# Patient Record
Sex: Male | Born: 1985 | Race: White | Hispanic: No | Marital: Single | Smoking: Current every day smoker
Health system: Southern US, Community
[De-identification: ages and names within clinical notes are randomized; demographics above are authoritative.]

## PROBLEM LIST (undated history)

## (undated) DIAGNOSIS — F419 Anxiety disorder, unspecified: Secondary | ICD-10-CM

## (undated) DIAGNOSIS — K029 Dental caries, unspecified: Secondary | ICD-10-CM

## (undated) DIAGNOSIS — F329 Major depressive disorder, single episode, unspecified: Secondary | ICD-10-CM

## (undated) DIAGNOSIS — F909 Attention-deficit hyperactivity disorder, unspecified type: Secondary | ICD-10-CM

## (undated) DIAGNOSIS — F32A Depression, unspecified: Secondary | ICD-10-CM

## (undated) HISTORY — PX: OTHER SURGICAL HISTORY: SHX169

---
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
## 2009-08-15 ENCOUNTER — Emergency Department (HOSPITAL_COMMUNITY): Admission: EM | Admit: 2009-08-15 | Discharge: 2009-08-15 | Payer: Self-pay | Admitting: Emergency Medicine

## 2010-09-04 ENCOUNTER — Emergency Department: Payer: Self-pay | Admitting: Emergency Medicine
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
## 2015-01-20 ENCOUNTER — Emergency Department: Payer: Self-pay | Admitting: Physician Assistant

## 2015-10-01 ENCOUNTER — Emergency Department
Admission: EM | Admit: 2015-10-01 | Discharge: 2015-10-01 | Disposition: A | Discharge status: Home / Self Care | Payer: No Typology Code available for payment source | Attending: Emergency Medicine | Admitting: Emergency Medicine

## 2015-10-01 ENCOUNTER — Encounter: Payer: Self-pay | Admitting: Emergency Medicine

## 2015-10-01 ENCOUNTER — Emergency Department: Payer: No Typology Code available for payment source

## 2015-10-01 DIAGNOSIS — Y9389 Activity, other specified: Secondary | ICD-10-CM | POA: Diagnosis not present

## 2015-10-01 DIAGNOSIS — W231XXA Caught, crushed, jammed, or pinched between stationary objects, initial encounter: Secondary | ICD-10-CM | POA: Diagnosis not present

## 2015-10-01 DIAGNOSIS — S20211A Contusion of right front wall of thorax, initial encounter: Secondary | ICD-10-CM | POA: Diagnosis not present

## 2015-10-01 DIAGNOSIS — Z87891 Personal history of nicotine dependence: Secondary | ICD-10-CM | POA: Diagnosis not present

## 2015-10-01 DIAGNOSIS — Y998 Other external cause status: Secondary | ICD-10-CM | POA: Diagnosis not present

## 2015-10-01 DIAGNOSIS — Y92039 Unspecified place in apartment as the place of occurrence of the external cause: Secondary | ICD-10-CM | POA: Diagnosis not present

## 2015-10-01 DIAGNOSIS — S299XXA Unspecified injury of thorax, initial encounter: Secondary | ICD-10-CM | POA: Diagnosis present

## 2015-10-01 HISTORY — DX: Anxiety disorder, unspecified: F41.9

## 2015-10-01 HISTORY — DX: Major depressive disorder, single episode, unspecified: F32.9

## 2015-10-01 HISTORY — DX: Depression, unspecified: F32.A

## 2015-10-01 MED ORDER — IBUPROFEN 800 MG PO TABS: 800.0000 mg | ORAL_TABLET | Freq: Three times a day (TID) | ORAL | Status: DC | PRN

## 2015-10-01 MED ORDER — TRAMADOL HCL 50 MG PO TABS
100.0000 mg | ORAL_TABLET | Freq: Once | ORAL | Status: AC
  Administered 2015-10-01: 100 mg via ORAL
  Filled 2015-10-01: qty 2

## 2015-10-01 MED ORDER — TRAMADOL HCL 50 MG PO TABS: 50.0000 mg | ORAL_TABLET | Freq: Four times a day (QID) | ORAL | Status: DC | PRN

## 2015-10-01 MED ORDER — IBUPROFEN 800 MG PO TABS
800.0000 mg | ORAL_TABLET | Freq: Once | ORAL | Status: DC
  Filled 2015-10-01: qty 1
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
## 2015-10-01 NOTE — ED Notes (Signed)
Patient transported to X-ray, amb w/o assistance.

## 2015-10-01 NOTE — Discharge Instructions (Signed)
Chest Contusion °A contusion is a deep bruise. Bruises happen when an injury causes bleeding under the skin. Signs of bruising include pain, puffiness (swelling), and discolored skin. The bruise may turn blue, purple, or yellow.  °HOME CARE °· Put ice on the injured area. °¨ Put ice in a plastic bag. °¨ Place a towel between the skin and the bag. °¨ Leave the ice on for 15-20 minutes at a time, 03-04 times a day for the first 48 hours. °· Only take medicine as told by your doctor. °· Rest. °· Take deep breaths (deep-breathing exercises) as told by your doctor. °· Stop smoking if you smoke. °· Do not lift objects over 5 pounds (2.3 kilograms) for 3 days or longer if told by your doctor. °GET HELP RIGHT AWAY IF:  °· You have more bruising or puffiness. °· You have pain that gets worse. °· You have trouble breathing. °· You are dizzy, weak, or pass out (faint). °· You have blood in your pee (urine) or poop (stool). °· You cough up or throw up (vomit) blood. °· Your puffiness or pain is not helped with medicines. °MAKE SURE YOU:  °· Understand these instructions. °· Will watch your condition. °· Will get help right away if you are not doing well or get worse. °  °This information is not intended to replace advice given to you by your health care provider. Make sure you discuss any questions you have with your health care provider. °  °Document Released: 05/25/2008 Document Revised: 08/31/2012 Document Reviewed: 05/30/2012 °Elsevier Interactive Patient Education ©2016 [employer] ° °
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
## 2016-12-04 ENCOUNTER — Emergency Department (HOSPITAL_COMMUNITY)
Admission: EM | Admit: 2016-12-04 | Discharge: 2016-12-04 | Disposition: A | Discharge status: Home / Self Care | Payer: No Typology Code available for payment source | Attending: Emergency Medicine | Admitting: Emergency Medicine

## 2016-12-04 ENCOUNTER — Encounter (HOSPITAL_COMMUNITY): Payer: Self-pay | Admitting: Emergency Medicine

## 2016-12-04 DIAGNOSIS — F17290 Nicotine dependence, other tobacco product, uncomplicated: Secondary | ICD-10-CM | POA: Insufficient documentation

## 2016-12-04 DIAGNOSIS — F17220 Nicotine dependence, chewing tobacco, uncomplicated: Secondary | ICD-10-CM | POA: Insufficient documentation

## 2016-12-04 DIAGNOSIS — K0889 Other specified disorders of teeth and supporting structures: Secondary | ICD-10-CM | POA: Insufficient documentation

## 2016-12-04 MED ORDER — HYDROCODONE-ACETAMINOPHEN 5-325 MG PO TABS
1.0000 | ORAL_TABLET | Freq: Once | ORAL | Status: DC
Start: 2016-12-04 — End: 2016-12-04
  Filled 2016-12-04: qty 1

## 2016-12-04 MED ORDER — CLINDAMYCIN HCL 150 MG PO CAPS: 300.0000 mg | ORAL_CAPSULE | Freq: Four times a day (QID) | ORAL | 0 refills | Status: DC

## 2016-12-04 MED ORDER — DICLOFENAC SODIUM 75 MG PO TBEC: 75.0000 mg | DELAYED_RELEASE_TABLET | Freq: Two times a day (BID) | ORAL | 0 refills | Status: DC

## 2016-12-04 MED ORDER — CLINDAMYCIN HCL 150 MG PO CAPS
300.0000 mg | ORAL_CAPSULE | Freq: Once | ORAL | Status: AC
  Administered 2016-12-04: 300 mg via ORAL
  Filled 2016-12-04: qty 2

## 2016-12-04 NOTE — ED Triage Notes (Signed)
Having dental pain about one month.  C/o swelling to roof of mouth.  Rates pain 9/10.

## 2016-12-04 NOTE — Discharge Instructions (Signed)
Follow-up with a dentist soon.  You can contact one on the list provided

## 2016-12-04 NOTE — ED Provider Notes (Signed)
[facility]-EMERGENCY DEPT Provider Note   CSN: [id number] Arrival date & time: 12/04/16  0930     History   Chief Complaint Chief Complaint  Patient presents with  . Dental Pain    HPI Jason Morton is a 30 y.o. male.  HPI   Jason Morton is a 30 y.o. male who presents to the Emergency Department complaining of dental pain for one month.  He reports hx of widespread dental decay and persistent pain to the right upper second molar with new onset of swelling to the adjacent gums.  Pain is worse with chewing and cold foods or liquids.  He has tried OTC topical numbing agents and tylenol and ibuprofen without relief.  He states he cannot afford to see a dentist.  He denies facial swelling, fever, chills, neck pain or difficulty swallowing.    Past Medical History:  Diagnosis Date  . Anxiety   . Depression     There are no active problems to display for this patient.   History reviewed. No pertinent surgical history.     Home Medications    Prior to Admission medications   Medication Sig Start Date End Date Taking? Authorizing Provider  traMADol (ULTRAM) 50 MG tablet Take 1 tablet (50 mg total) by mouth every 6 (six) hours as needed. 10/01/15   Evon Slackhomas C Gaines, PA-C    Family History History reviewed. No pertinent family history.  Social History Social History  Substance Use Topics  . Smoking status: Current Every Day Smoker    Types: E-cigarettes  . Smokeless tobacco: Current User    Types: Chew  . Alcohol use Yes     Allergies   Patient has no known allergies.   Review of Systems Review of Systems  Constitutional: Negative for appetite change and fever.  HENT: Positive for dental problem. Negative for congestion, facial swelling, sore throat and trouble swallowing.   Eyes: Negative for pain and visual disturbance.  Musculoskeletal: Negative for neck pain and neck stiffness.  Neurological: Negative for dizziness, facial asymmetry and headaches.    Hematological: Negative for adenopathy.  All other systems reviewed and are negative.    Physical Exam Updated Vital Signs BP 106/72   Pulse 60   Temp 98.1 F (36.7 C) (Oral)   Resp 15   SpO2 99%   Physical Exam  Constitutional: He is oriented to person, place, and time. He appears well-developed and well-nourished. No distress.  HENT:  Head: Normocephalic and atraumatic.  Right Ear: Tympanic membrane and ear canal normal.  Left Ear: Tympanic membrane and ear canal normal.  Mouth/Throat: Uvula is midline, oropharynx is clear and moist and mucous membranes are normal. No trismus in the jaw. Dental caries present. No dental abscesses or uvula swelling.  Tenderness and dental caries of the right upper second molar and erythema of the surrounding gingiva, no fluctuance. Widespread dental decay.  No facial swelling, obvious dental abscess, trismus, or sublingual abnml.    Neck: Normal range of motion. Neck supple.  Cardiovascular: Normal rate, regular rhythm and normal heart sounds.   No murmur heard. Pulmonary/Chest: Effort normal and breath sounds normal.  Musculoskeletal: Normal range of motion.  Lymphadenopathy:    He has no cervical adenopathy.  Neurological: He is alert and oriented to person, place, and time. He exhibits normal muscle tone. Coordination normal.  Skin: Skin is warm and dry.  Nursing note and vitals reviewed.    ED Treatments / Results  Labs (all labs ordered are listed,  but only abnormal results are displayed) Labs Reviewed - No data to display  EKG  EKG Interpretation None       Radiology No results found.  Procedures Procedures (including critical care time)  Medications Ordered in ED Medications - No data to display   Initial Impression / Assessment and Plan / ED Course  I have reviewed the triage vital signs and the nursing notes.  Pertinent labs & imaging results that were available during my care of the patient were reviewed by me  and considered in my medical decision making (see chart for details).  Clinical Course     Pt with dental decay and possible early developing abscess.  Airway remains patent.  No clinical signs of Ludwig's angina.  Uvula is midline and non-edematous.  No trismus.  Pt encouraged to f/u with a dentist,  Referral info given.  rx for clinda and diclofenac.    Final Clinical Impressions(s) / ED Diagnoses   Final diagnoses:  Pain, dental    New Prescriptions New Prescriptions   No medications on file     Pauline Ausammy Betzayda Braxton, PA-C 12/04/16 1052    Donnetta HutchingBrian Cook, MD 12/05/16 [phone]

## 2018-12-28 ENCOUNTER — Emergency Department
Admission: EM | Admit: 2018-12-28 | Discharge: 2018-12-28 | Disposition: A | Discharge status: Home / Self Care | Payer: Self-pay | Attending: Emergency Medicine | Admitting: Emergency Medicine

## 2018-12-28 ENCOUNTER — Encounter: Payer: Self-pay | Admitting: Emergency Medicine

## 2018-12-28 ENCOUNTER — Emergency Department: Payer: Self-pay

## 2018-12-28 DIAGNOSIS — J101 Influenza due to other identified influenza virus with other respiratory manifestations: Secondary | ICD-10-CM | POA: Insufficient documentation

## 2018-12-28 DIAGNOSIS — F17290 Nicotine dependence, other tobacco product, uncomplicated: Secondary | ICD-10-CM | POA: Insufficient documentation

## 2018-12-28 LAB — INFLUENZA PANEL BY PCR (TYPE A & B)
INFLAPCR: NEGATIVE
INFLBPCR: POSITIVE — AB

## 2018-12-28 MED ORDER — ALBUTEROL SULFATE HFA 108 (90 BASE) MCG/ACT IN AERS: 2.0000 | INHALATION_SPRAY | Freq: Four times a day (QID) | RESPIRATORY_TRACT | 2 refills | Status: DC | PRN

## 2018-12-28 MED ORDER — ACETAMINOPHEN 325 MG PO TABS
ORAL_TABLET | ORAL | Status: AC
  Administered 2018-12-28: 650 mg via ORAL
  Filled 2018-12-28: qty 2

## 2018-12-28 MED ORDER — IPRATROPIUM-ALBUTEROL 0.5-2.5 (3) MG/3ML IN SOLN
3.0000 mL | Freq: Once | RESPIRATORY_TRACT | Status: AC
  Administered 2018-12-28: 3 mL via RESPIRATORY_TRACT
  Filled 2018-12-28: qty 3

## 2018-12-28 MED ORDER — OSELTAMIVIR PHOSPHATE 75 MG PO CAPS: 75.0000 mg | ORAL_CAPSULE | Freq: Two times a day (BID) | ORAL | 0 refills | Status: DC

## 2018-12-28 MED ORDER — BENZONATATE 200 MG PO CAPS: 200.0000 mg | ORAL_CAPSULE | Freq: Three times a day (TID) | ORAL | 0 refills | Status: DC | PRN

## 2018-12-28 MED ORDER — ACETAMINOPHEN 325 MG PO TABS
650.0000 mg | ORAL_TABLET | Freq: Once | ORAL | Status: AC
  Administered 2018-12-28: 650 mg via ORAL

## 2018-12-28 NOTE — ED Notes (Signed)
Fisher, PA-C at bedside.

## 2018-12-28 NOTE — ED Triage Notes (Signed)
Pt reports flu-like sx's for the past 2 dasy, unsure of fevers.

## 2018-12-28 NOTE — ED Provider Notes (Signed)
[facility] Emergency Department Provider Note  ____________________________________________   First MD Initiated Contact with Patient 12/28/18 1409     (approximate)  I have reviewed the triage vital signs and the nursing notes.   HISTORY  Chief Complaint Influenza; Cough; Nasal Congestion; Generalized Body Aches; and Headache    HPI IBRAHIMA ZARZA is a 33 y.o. male flulike symptoms, patient is complained of fever, chills, body aches.,cough, sore throat, denies vomiting, denies diarrhea; denies chest pain or sob.  Sx for 1 days   Past Medical History:  Diagnosis Date  . Anxiety   . Depression     There are no active problems to display for this patient.   History reviewed. No pertinent surgical history.  Prior to Admission medications   Medication Sig Start Date End Date Taking? Authorizing Provider  albuterol (PROVENTIL HFA;VENTOLIN HFA) 108 (90 Base) MCG/ACT inhaler Inhale 2 puffs into the lungs every 6 (six) hours as needed for wheezing or shortness of breath. 12/28/18   Fisher, Roselyn Bering, PA-C  benzonatate (TESSALON) 200 MG capsule Take 1 capsule (200 mg total) by mouth 3 (three) times daily as needed for cough. 12/28/18   Fisher, Roselyn Bering, PA-C  clindamycin (CLEOCIN) 150 MG capsule Take 2 capsules (300 mg total) by mouth 4 (four) times daily. For 7 days 12/04/16   Pauline Aus, PA-C  diclofenac (VOLTAREN) 75 MG EC tablet Take 1 tablet (75 mg total) by mouth 2 (two) times daily. Take with food 12/04/16   Triplett, Tammy, PA-C  oseltamivir (TAMIFLU) 75 MG capsule Take 1 capsule (75 mg total) by mouth 2 (two) times daily. 12/28/18   Fisher, Roselyn Bering, PA-C  traMADol (ULTRAM) 50 MG tablet Take 1 tablet (50 mg total) by mouth every 6 (six) hours as needed. 10/01/15   Evon Slack, PA-C    Allergies Patient has no known allergies.  No family history on file.  Social History Social History   Tobacco Use  . Smoking status: Current Every Day Smoker     Types: E-cigarettes  . Smokeless tobacco: Current User    Types: Chew  Substance Use Topics  . Alcohol use: Yes  . Drug use: No    Review of Systems  Constitutional: Positive fever/chills Eyes: No visual changes. ENT: Positive sore throat. Respiratory: Positive cough Genitourinary: Negative for dysuria. Musculoskeletal: Negative for back pain. Skin: Negative for rash.    ____________________________________________   PHYSICAL EXAM:  VITAL SIGNS: ED Triage Vitals  Enc Vitals Group     BP 12/28/18 1253 124/65     Pulse Rate 12/28/18 1253 85     Resp 12/28/18 1253 16     Temp 12/28/18 1253 99.7 F (37.6 C)     Temp Source 12/28/18 1253 Oral     SpO2 12/28/18 1253 98 %     Weight 12/28/18 1251 112 lb (50.8 kg)     Height 12/28/18 1251 5\' 4"  (1.626 m)     Head Circumference --      Peak Flow --      Pain Score 12/28/18 1251 7     Pain Loc --      Pain Edu? --      Excl. in GC? --     Constitutional: Alert and oriented. Well appearing and in no acute distress. Eyes: Conjunctivae are normal.  Head: Atraumatic. Nose: No congestion/rhinnorhea. Mouth/Throat: Mucous membranes are moist.   Neck:  supple no lymphadenopathy noted Cardiovascular: Normal rate, regular rhythm. Heart sounds  are normal Respiratory: Normal respiratory effort.  No retractions, lungs with wheezing in the upper lung fields Abd: soft nontender bs normal all 4 quad GU: deferred Musculoskeletal: FROM all extremities, warm and well perfused Neurologic:  Normal speech and language.  Skin:  Skin is warm, dry and intact. No rash noted. Psychiatric: Mood and affect are normal. Speech and behavior are normal.  ____________________________________________   LABS (all labs ordered are listed, but only abnormal results are displayed)  Labs Reviewed  INFLUENZA PANEL BY PCR (TYPE A & B) - Abnormal; Notable for the following components:      Result Value   Influenza B By PCR POSITIVE (*)    All  other components within normal limits   ____________________________________________   ____________________________________________  RADIOLOGY  Chest x-ray is negative for pneumonia  ____________________________________________   PROCEDURES  Procedure(s) performed: DuoNeb   Procedures    ____________________________________________   INITIAL IMPRESSION / ASSESSMENT AND PLAN / ED COURSE  Pertinent labs & imaging results that were available during my care of the patient were reviewed by me and considered in my medical decision making (see chart for details).   Patient is 33 year old male presents emergency department with flulike symptoms.  Physical exam shows nontoxic male, wheezing in both lungs, remainder the exam is unremarkable  Flu swab Chest x-ray DuoNeb    ----------------------------------------- 4:39 PM on 12/28/2018 -----------------------------------------  Flu swab is positive for influenza B, chest x-ray was not resulted before the patient left but appears to be negative for pneumonia.  Patient was given a DuoNeb which resolved the wheezing.  All lab results and treatment plan were discussed with patient.  He is to follow-up with his regular doctor if not better in 3 days.  Return emergency department worsening.  He was given a work note stating that he has influenza B and should not return until he has been fever free for 24 hours because he is contagious.  He states he understands and will comply he was discharged in stable condition in the care of her family member.  As part of my medical decision making, I reviewed the following data within the electronic MEDICAL RECORD NUMBER History obtained from family, Nursing notes reviewed and incorporated, Labs reviewed influenza test is positive for B, Old chart reviewed, Radiograph reviewed chest x-ray is negative for pneumonia, Notes from prior ED visits and [place] Controlled Substance  Database  ____________________________________________   FINAL CLINICAL IMPRESSION(S) / ED DIAGNOSES  Final diagnoses:  Influenza B      NEW MEDICATIONS STARTED DURING THIS VISIT:  New Prescriptions   ALBUTEROL (PROVENTIL HFA;VENTOLIN HFA) 108 (90 BASE) MCG/ACT INHALER    Inhale 2 puffs into the lungs every 6 (six) hours as needed for wheezing or shortness of breath.   BENZONATATE (TESSALON) 200 MG CAPSULE    Take 1 capsule (200 mg total) by mouth 3 (three) times daily as needed for cough.   OSELTAMIVIR (TAMIFLU) 75 MG CAPSULE    Take 1 capsule (75 mg total) by mouth 2 (two) times daily.     Note:  This document was prepared using Dragon voice recognition software and may include unintentional dictation errors.    Faythe GheeFisher, Susan W, PA-C 12/28/18 1705    Rockne MenghiniNorman, Anne-Caroline, MD 12/29/18 2152

## 2018-12-28 NOTE — ED Notes (Signed)
C/o flu like symptoms of body aches, fever, headache, and chills/sweats

## 2018-12-28 NOTE — Discharge Instructions (Addendum)
Follow-up with your regular doctor if not better in 3 to 5 days.  Return emergency department if worsening.  Take the medications as prescribed.  Drink plenty of fluids.

## 2019-05-19 ENCOUNTER — Other Ambulatory Visit: Payer: Self-pay

## 2019-05-19 ENCOUNTER — Encounter: Payer: Self-pay | Admitting: Emergency Medicine

## 2019-05-19 ENCOUNTER — Emergency Department: Payer: Self-pay

## 2019-05-19 ENCOUNTER — Emergency Department
Admission: EM | Admit: 2019-05-19 | Discharge: 2019-05-19 | Disposition: A | Discharge status: Other Acute Inpatient Hospital | Payer: Self-pay | Attending: Emergency Medicine | Admitting: Emergency Medicine

## 2019-05-19 DIAGNOSIS — W228XXA Striking against or struck by other objects, initial encounter: Secondary | ICD-10-CM | POA: Insufficient documentation

## 2019-05-19 DIAGNOSIS — S065X0A Traumatic subdural hemorrhage without loss of consciousness, initial encounter: Secondary | ICD-10-CM | POA: Insufficient documentation

## 2019-05-19 DIAGNOSIS — Y929 Unspecified place or not applicable: Secondary | ICD-10-CM | POA: Insufficient documentation

## 2019-05-19 DIAGNOSIS — S065X9A Traumatic subdural hemorrhage with loss of consciousness of unspecified duration, initial encounter: Secondary | ICD-10-CM

## 2019-05-19 DIAGNOSIS — Z79899 Other long term (current) drug therapy: Secondary | ICD-10-CM | POA: Insufficient documentation

## 2019-05-19 DIAGNOSIS — F17200 Nicotine dependence, unspecified, uncomplicated: Secondary | ICD-10-CM | POA: Insufficient documentation

## 2019-05-19 DIAGNOSIS — Y939 Activity, unspecified: Secondary | ICD-10-CM | POA: Insufficient documentation

## 2019-05-19 DIAGNOSIS — Y999 Unspecified external cause status: Secondary | ICD-10-CM | POA: Insufficient documentation

## 2019-05-19 DIAGNOSIS — S065XAA Traumatic subdural hemorrhage with loss of consciousness status unknown, initial encounter: Secondary | ICD-10-CM

## 2019-05-19 DIAGNOSIS — S0291XB Unspecified fracture of skull, initial encounter for open fracture: Secondary | ICD-10-CM | POA: Insufficient documentation

## 2019-05-19 HISTORY — DX: Attention-deficit hyperactivity disorder, unspecified type: F90.9

## 2019-05-19 LAB — COMPREHENSIVE METABOLIC PANEL
ALT: 15 U/L (ref 0–44)
AST: 24 U/L (ref 15–41)
Albumin: 4.8 g/dL (ref 3.5–5.0)
Alkaline Phosphatase: 63 U/L (ref 38–126)
Anion gap: 10 (ref 5–15)
BUN: 9 mg/dL (ref 6–20)
CO2: 26 mmol/L (ref 22–32)
Calcium: 9.2 mg/dL (ref 8.9–10.3)
Chloride: 104 mmol/L (ref 98–111)
Creatinine, Ser: 1.08 mg/dL (ref 0.61–1.24)
GFR calc Af Amer: >60 mL/min (ref >60)
GFR calc non Af Amer: >60 mL/min (ref >60)
Glucose, Bld: 91 mg/dL (ref 70–99)
Potassium: 3.4 mmol/L — ABNORMAL LOW (ref 3.5–5.1)
Sodium: 140 mmol/L (ref 135–145)
Total Bilirubin: 0.5 mg/dL (ref 0.3–1.2)
Total Protein: 7.8 g/dL (ref 6.5–8.1)

## 2019-05-19 LAB — CBC
HCT: 41.1 % (ref 39.0–52.0)
Hemoglobin: 13.7 g/dL (ref 13.0–17.0)
MCH: 29 pg (ref 26.0–34.0)
MCHC: 33.3 g/dL (ref 30.0–36.0)
MCV: 87.1 fL (ref 80.0–100.0)
Platelets: 245 10*3/uL (ref 150–400)
RBC: 4.72 MIL/uL (ref 4.22–5.81)
RDW: 12.2 % (ref 11.5–15.5)
WBC: 12.7 10*3/uL — ABNORMAL HIGH (ref 4.0–10.5)
nRBC: 0 % (ref 0.0–0.2)

## 2019-05-19 LAB — PROTIME-INR
INR: 1 (ref 0.8–1.2)
Prothrombin Time: 12.6 seconds (ref 11.4–15.2)

## 2019-05-19 LAB — APTT: aPTT: 31 seconds (ref 24–36)

## 2019-05-19 MED ORDER — LEVETIRACETAM IN NACL 1000 MG/100ML IV SOLN
1000.0000 mg | Freq: Once | INTRAVENOUS | Status: AC
  Administered 2019-05-19: 22:00:00 1000 mg via INTRAVENOUS
  Filled 2019-05-19: qty 100

## 2019-05-19 MED ORDER — ONDANSETRON HCL 4 MG/2ML IJ SOLN
4.0000 mg | Freq: Once | INTRAMUSCULAR | Status: AC
  Administered 2019-05-19: 4 mg via INTRAVENOUS
  Filled 2019-05-19: qty 2

## 2019-05-19 MED ORDER — MORPHINE SULFATE (PF) 4 MG/ML IV SOLN
4.0000 mg | Freq: Once | INTRAVENOUS | Status: AC
  Administered 2019-05-19: 4 mg via INTRAVENOUS
  Filled 2019-05-19: qty 1

## 2019-05-19 NOTE — ED Notes (Signed)
[facility]AL[facility] and Medical Necessity documentation reviewed at this time and found to be complete per policy. 
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
## 2019-05-19 NOTE — ED Provider Notes (Signed)
[facility] Emergency Department Provider Note   ____________________________________________    I have reviewed the triage vital signs and the nursing notes.   HISTORY  Chief Complaint Head Injury     HPI Jason Morton is a 33 y.o. male who presents with a head injury.  Patient reports he was struck in the head accidentally by a 50 pound steel rod, this occurred just prior to arrival.  He describes severe pain in the left described of his head.  Denies other injuries.  Does report drinking alcohol.  No neuro deficits.  Pain is described as sharp and throbbing  Past Medical History:  Diagnosis Date  . ADHD   . Anxiety   . Depression     There are no active problems to display for this patient.   Past Surgical History:  Procedure Laterality Date  . arm surgery Left     Prior to Admission medications   Medication Sig Start Date End Date Taking? Authorizing Provider  albuterol (PROVENTIL HFA;VENTOLIN HFA) 108 (90 Base) MCG/ACT inhaler Inhale 2 puffs into the lungs every 6 (six) hours as needed for wheezing or shortness of breath. 12/28/18   Fisher, Roselyn Bering, PA-C  benzonatate (TESSALON) 200 MG capsule Take 1 capsule (200 mg total) by mouth 3 (three) times daily as needed for cough. 12/28/18   Fisher, Roselyn Bering, PA-C  clindamycin (CLEOCIN) 150 MG capsule Take 2 capsules (300 mg total) by mouth 4 (four) times daily. For 7 days 12/04/16   Pauline Aus, PA-C  diclofenac (VOLTAREN) 75 MG EC tablet Take 1 tablet (75 mg total) by mouth 2 (two) times daily. Take with food 12/04/16   Triplett, Tammy, PA-C  oseltamivir (TAMIFLU) 75 MG capsule Take 1 capsule (75 mg total) by mouth 2 (two) times daily. 12/28/18   Fisher, Roselyn Bering, PA-C  traMADol (ULTRAM) 50 MG tablet Take 1 tablet (50 mg total) by mouth every 6 (six) hours as needed. 10/01/15   Evon Slack, PA-C     Allergies Patient has no known allergies.  No family history on file.  Social History  Social History   Tobacco Use  . Smoking status: Current Every Day Smoker    Types: E-cigarettes  . Smokeless tobacco: Current User    Types: Chew  Substance Use Topics  . Alcohol use: Yes  . Drug use: No    Review of Systems  Constitutional: No dizziness Eyes: No visual changes.  ENT: No neck pain Cardiovascular: Denies palpitations Respiratory: Denies cough Gastrointestinal: No nausea, no vomiting.   Genitourinary: Negative for incontinence Musculoskeletal: Negative for back pain. Skin: Laceration to the scalp Neurological: Headache, no neuro deficits   ____________________________________________   PHYSICAL EXAM:  VITAL SIGNS: ED Triage Vitals  Enc Vitals Group     BP 05/19/19 2040 117/65     Pulse Rate 05/19/19 2040 82     Resp 05/19/19 2040 18     Temp 05/19/19 2040 98.5 F (36.9 C)     Temp Source 05/19/19 2040 Oral     SpO2 05/19/19 2040 100 %     Weight 05/19/19 2048 68 kg (150 lb)     Height 05/19/19 2048 1.626 m (5\' 4" )     Head Circumference --      Peak Flow --      Pain Score 05/19/19 2047 8     Pain Loc --      Pain Edu? --      Excl. in  GC? --     Constitutional: Alert and oriented. Eyes: Conjunctivae are normal.  Head: 4 cm linear relatively shallow laceration overlying the left parietal region Nose: No congestion/rhinnorhea. Mouth/Throat: Mucous membranes are moist.   Neck:  Painless ROM, no vertebral tenderness palpation Cardiovascular: Normal rate, regular rhythm Good peripheral circulation. Respiratory: Normal respiratory effort.  No retractions. Lungs CTAB.  Musculoskeletal: .  Warm and well perfused Neurologic:  Normal speech and language. No gross focal neurologic deficits are appreciated.  ECS 15 Skin:  Skin is warm, dry, as above Psychiatric: Mood and affect are normal. Speech and behavior are normal.  ____________________________________________   LABS (all labs ordered are listed, but only abnormal results are displayed)   Labs Reviewed  CBC - Abnormal; Notable for the following components:      Result Value   WBC 12.7 (*)    All other components within normal limits  COMPREHENSIVE METABOLIC PANEL - Abnormal; Notable for the following components:   Potassium 3.4 (*)    All other components within normal limits  APTT  PROTIME-INR   ____________________________________________  EKG  None ____________________________________________  RADIOLOGY  CT demonstrates depressed skull fracture with 3 mm subdural ____________________________________________   PROCEDURES  Procedure(s) performed: No  Procedures   Critical Care performed: yes  CRITICAL CARE Performed by: Jene Everyobert Kalysta Kneisley   Total critical care time: 30minutes  Critical care time was exclusive of separately billable procedures and treating other patients.  Critical care was necessary to treat or prevent imminent or life-threatening deterioration.  Critical care was time spent personally by me on the following activities: development of treatment plan with patient and/or surrogate as well as nursing, discussions with consultants, evaluation of patient's response to treatment, examination of patient, obtaining history from patient or surrogate, ordering and performing treatments and interventions, ordering and review of laboratory studies, ordering and review of radiographic studies, pulse oximetry and re-evaluation of patient's condition.  ____________________________________________   INITIAL IMPRESSION / ASSESSMENT AND PLAN / ED COURSE  Pertinent labs & imaging results that were available during my care of the patient were reviewed by me and considered in my medical decision making (see chart for details).  Patient presents with head injury as detailed above, CT scan is concerning for depressed skull fracture with 3 mm subdural hematoma.  Linear shallow laceration overlying this, does not appear to be an open fracture although this is  possible.  Will give IV morphine, IV Zofran, IV Keppra  Have discussed with [facility] transfer center they have accepted in transfer    ____________________________________________   FINAL CLINICAL IMPRESSION(S) / ED DIAGNOSES  Final diagnoses:  Open depressed fracture of skull, initial encounter (HCC)  Subdural hematoma (HCC)        Note:  This document was prepared using Dragon voice recognition software and may include unintentional dictation errors.   Jene EveryKinner, Isha Seefeld, MD 05/19/19 2244

## 2019-09-05 ENCOUNTER — Other Ambulatory Visit: Payer: Self-pay

## 2019-09-05 ENCOUNTER — Emergency Department (HOSPITAL_COMMUNITY)
Admission: EM | Admit: 2019-09-05 | Discharge: 2019-09-05 | Disposition: A | Discharge status: Home / Self Care | Payer: Self-pay | Attending: Emergency Medicine | Admitting: Emergency Medicine

## 2019-09-05 DIAGNOSIS — T6291XA Toxic effect of unspecified noxious substance eaten as food, accidental (unintentional), initial encounter: Secondary | ICD-10-CM | POA: Insufficient documentation

## 2019-09-05 DIAGNOSIS — F17210 Nicotine dependence, cigarettes, uncomplicated: Secondary | ICD-10-CM | POA: Insufficient documentation

## 2019-09-05 DIAGNOSIS — R197 Diarrhea, unspecified: Secondary | ICD-10-CM | POA: Insufficient documentation

## 2019-09-05 DIAGNOSIS — A059 Bacterial foodborne intoxication, unspecified: Secondary | ICD-10-CM

## 2019-09-05 MED ORDER — ONDANSETRON 4 MG PO TBDP: 4.0000 mg | ORAL_TABLET | Freq: Three times a day (TID) | ORAL | 0 refills | Status: DC | PRN

## 2019-09-05 NOTE — ED Triage Notes (Signed)
Pt has been having diarrhea since Sunday evening after eating chinese food. Has not taken any medication to relieve diarrhea. States diarrhea is watery. NAD. Ambulatory

## 2019-09-05 NOTE — ED Provider Notes (Signed)
[facility] EMERGENCY DEPARTMENT Provider Note   CSN: [id number] Arrival date & time: 09/05/19  7371     History   Chief Complaint Chief Complaint  Patient presents with  . Diarrhea    HPI CALBERT HULSEBUS is a 33 y.o. male.     HPI  The patient is a 33 year old male who presents with diarrhea which is been going on for the last 2 days ever since eating [place] food on Sunday evening.  He has multiple episodes of watery diarrhea totaling more than 10 in the last 24 hours.  He denies significant abdominal pain, there is no nausea or vomiting, he has been able to drink fluids but feels like he might be a bit dehydrated.  No lightheadedness, no nausea, no shortness of breath, no generalized weakness just a bit of fatigue.  He does not feel like he can go to work today because of frequent stools.  Nobody else got sick, he states that the chicken and the food he was eating did not taste right  Past Medical History:  Diagnosis Date  . ADHD   . Anxiety   . Depression     There are no active problems to display for this patient.   Past Surgical History:  Procedure Laterality Date  . arm surgery Left         Home Medications    Prior to Admission medications   Medication Sig Start Date End Date Taking? Authorizing Provider  albuterol (PROVENTIL HFA;VENTOLIN HFA) 108 (90 Base) MCG/ACT inhaler Inhale 2 puffs into the lungs every 6 (six) hours as needed for wheezing or shortness of breath. 12/28/18   Fisher, Linden Dolin, PA-C  benzonatate (TESSALON) 200 MG capsule Take 1 capsule (200 mg total) by mouth 3 (three) times daily as needed for cough. 12/28/18   Fisher, Linden Dolin, PA-C  clindamycin (CLEOCIN) 150 MG capsule Take 2 capsules (300 mg total) by mouth 4 (four) times daily. For 7 days 12/04/16   Kem Parkinson, PA-C  diclofenac (VOLTAREN) 75 MG EC tablet Take 1 tablet (75 mg total) by mouth 2 (two) times daily. Take with food 12/04/16   Triplett, Tammy, PA-C  ondansetron (ZOFRAN ODT) 4  MG disintegrating tablet Take 1 tablet (4 mg total) by mouth every 8 (eight) hours as needed for nausea. 09/05/19   Noemi Chapel, MD  oseltamivir (TAMIFLU) 75 MG capsule Take 1 capsule (75 mg total) by mouth 2 (two) times daily. 12/28/18   Fisher, Linden Dolin, PA-C  traMADol (ULTRAM) 50 MG tablet Take 1 tablet (50 mg total) by mouth every 6 (six) hours as needed. 10/01/15   Duanne Guess, PA-C    Family History No family history on file.  Social History Social History   Tobacco Use  . Smoking status: Current Every Day Smoker    Types: E-cigarettes  . Smokeless tobacco: Current User    Types: Chew  Substance Use Topics  . Alcohol use: Yes  . Drug use: No     Allergies   Patient has no known allergies.   Review of Systems Review of Systems  Constitutional: Negative for fever.  Gastrointestinal: Positive for diarrhea. Negative for nausea.     Physical Exam Updated Vital Signs BP 113/76 (BP Location: Right Arm)   Pulse (!) 48   Temp 97.7 F (36.5 C) (Oral)   Resp 12   Ht 1.626 m (5\' 4" )   Wt 68 kg   SpO2 100%   BMI 25.73 kg/m  Physical Exam Vitals signs and nursing note reviewed.  Constitutional:      General: He is not in acute distress.    Appearance: He is well-developed.  HENT:     Head: Normocephalic and atraumatic.     Mouth/Throat:     Pharynx: No oropharyngeal exudate.  Eyes:     General: No scleral icterus.       Right eye: No discharge.        Left eye: No discharge.     Conjunctiva/sclera: Conjunctivae normal.     Pupils: Pupils are equal, round, and reactive to light.  Neck:     Musculoskeletal: Normal range of motion and neck supple.     Thyroid: No thyromegaly.     Vascular: No JVD.  Cardiovascular:     Rate and Rhythm: Normal rate and regular rhythm.     Heart sounds: Normal heart sounds. No murmur. No friction rub. No gallop.   Pulmonary:     Effort: Pulmonary effort is normal. No respiratory distress.     Breath sounds: Normal breath  sounds. No wheezing or rales.  Abdominal:     General: Bowel sounds are normal. There is no distension.     Palpations: Abdomen is soft. There is no mass.     Tenderness: There is no abdominal tenderness.  Musculoskeletal: Normal range of motion.        General: No tenderness.  Lymphadenopathy:     Cervical: No cervical adenopathy.  Skin:    General: Skin is warm and dry.     Findings: No erythema or rash.  Neurological:     Mental Status: He is alert.     Coordination: Coordination normal.  Psychiatric:        Behavior: Behavior normal.      ED Treatments / Results  Labs (all labs ordered are listed, but only abnormal results are displayed) Labs Reviewed - No data to display  EKG None  Radiology No results found.  Procedures Procedures (including critical care time)  Medications Ordered in ED Medications - No data to display   Initial Impression / Assessment and Plan / ED Course  I have reviewed the triage vital signs and the nursing notes.  Pertinent labs & imaging results that were available during my care of the patient were reviewed by me and considered in my medical decision making (see chart for details).        The patient is well-appearing, he does not appear to have any severe abdominal pain and he is has no tenderness.  He appears hydrated, vital signs reflect a mild bradycardia not unusual for someone who is very healthy.  I do not think this is pathological and certainly he does not appear to have a tachycardia or hypotension.  He will be given Zofran as needed taken out of work for 1 day and encouraged to follow-up closely and hydrate aggressively.  He is agreeable  Final Clinical Impressions(s) / ED Diagnoses   Final diagnoses:  Diarrhea, unspecified type  Food poisoning    ED Discharge Orders         Ordered    ondansetron (ZOFRAN ODT) 4 MG disintegrating tablet  Every 8 hours PRN     09/05/19 0930           Eber HongMiller, Mercy Malena, MD 09/05/19  [phone]

## 2019-09-05 NOTE — Discharge Instructions (Signed)
You may take Imodium, 2 caplets right away and then 1 caplet with every stool.  Maximum of 8 in 1 day.  This is an over-the-counter medication.  Zofran for nausea  Stay out of work today, drink plenty of fluids, you will likely have diarrhea for another day or 2.  Seek medical exam for severe or worsening pain fever or persistent vomiting.

## 2019-10-19 ENCOUNTER — Emergency Department (HOSPITAL_COMMUNITY)
Admission: EM | Admit: 2019-10-19 | Discharge: 2019-10-19 | Disposition: A | Discharge status: Home / Self Care | Payer: Self-pay | Attending: Emergency Medicine | Admitting: Emergency Medicine

## 2019-10-19 ENCOUNTER — Encounter (HOSPITAL_COMMUNITY): Payer: Self-pay | Admitting: *Deleted

## 2019-10-19 ENCOUNTER — Other Ambulatory Visit: Payer: Self-pay

## 2019-10-19 DIAGNOSIS — F17220 Nicotine dependence, chewing tobacco, uncomplicated: Secondary | ICD-10-CM | POA: Insufficient documentation

## 2019-10-19 DIAGNOSIS — F17290 Nicotine dependence, other tobacco product, uncomplicated: Secondary | ICD-10-CM | POA: Insufficient documentation

## 2019-10-19 DIAGNOSIS — R5383 Other fatigue: Secondary | ICD-10-CM | POA: Insufficient documentation

## 2019-10-19 DIAGNOSIS — Z20828 Contact with and (suspected) exposure to other viral communicable diseases: Secondary | ICD-10-CM | POA: Insufficient documentation

## 2019-10-19 DIAGNOSIS — M7918 Myalgia, other site: Secondary | ICD-10-CM | POA: Insufficient documentation

## 2019-10-19 DIAGNOSIS — R519 Headache, unspecified: Secondary | ICD-10-CM | POA: Insufficient documentation

## 2019-10-19 DIAGNOSIS — J069 Acute upper respiratory infection, unspecified: Secondary | ICD-10-CM | POA: Insufficient documentation

## 2019-10-19 NOTE — ED Provider Notes (Signed)
[facility] EMERGENCY DEPARTMENT Provider Note   CSN: [id number] Arrival date & time: 10/19/19  1026     History   Chief Complaint Chief Complaint  Patient presents with  . Generalized Body Aches    HPI Jason Morton is a 33 y.o. male.     33 year old male presents with complaint of fever of 100 with chills, body aches, headache, fatigue.  Patient denies changes in bowel or bladder habits, cough, loss of sense of taste or smell.  No known exposure to anyone with Covid.  Patient suspects he has a cold however needs a note to return to work due to current pandemic.  No other complaints or concerns.  Jason Morton was evaluated in Emergency Department on 10/19/2019 for the symptoms described in the history of present illness. He was evaluated in the context of the global COVID-19 pandemic, which necessitated consideration that the patient might be at risk for infection with the SARS-CoV-2 virus that causes COVID-19. Institutional protocols and algorithms that pertain to the evaluation of patients at risk for COVID-19 are in a state of rapid change based on information released by regulatory bodies including the CDC and federal and state organizations. These policies and algorithms were followed during the patient's care in the ED.      Past Medical History:  Diagnosis Date  . ADHD   . Anxiety   . Depression     There are no active problems to display for this patient.   Past Surgical History:  Procedure Laterality Date  . arm surgery Left         Home Medications    Prior to Admission medications   Medication Sig Start Date End Date Taking? Authorizing Provider  albuterol (PROVENTIL HFA;VENTOLIN HFA) 108 (90 Base) MCG/ACT inhaler Inhale 2 puffs into the lungs every 6 (six) hours as needed for wheezing or shortness of breath. 12/28/18   Fisher, Linden Dolin, PA-C  benzonatate (TESSALON) 200 MG capsule Take 1 capsule (200 mg total) by mouth 3 (three) times daily as needed for  cough. 12/28/18   Fisher, Linden Dolin, PA-C  clindamycin (CLEOCIN) 150 MG capsule Take 2 capsules (300 mg total) by mouth 4 (four) times daily. For 7 days 12/04/16   Kem Parkinson, PA-C  diclofenac (VOLTAREN) 75 MG EC tablet Take 1 tablet (75 mg total) by mouth 2 (two) times daily. Take with food 12/04/16   Triplett, Tammy, PA-C  ondansetron (ZOFRAN ODT) 4 MG disintegrating tablet Take 1 tablet (4 mg total) by mouth every 8 (eight) hours as needed for nausea. 09/05/19   Noemi Chapel, MD  oseltamivir (TAMIFLU) 75 MG capsule Take 1 capsule (75 mg total) by mouth 2 (two) times daily. 12/28/18   Fisher, Linden Dolin, PA-C  traMADol (ULTRAM) 50 MG tablet Take 1 tablet (50 mg total) by mouth every 6 (six) hours as needed. 10/01/15   Duanne Guess, PA-C    Family History History reviewed. No pertinent family history.  Social History Social History   Tobacco Use  . Smoking status: Current Every Day Smoker    Types: E-cigarettes  . Smokeless tobacco: Current User    Types: Chew  Substance Use Topics  . Alcohol use: Yes  . Drug use: No     Allergies   Patient has no known allergies.   Review of Systems Review of Systems  Constitutional: Positive for chills and fever.  HENT: Positive for congestion. Negative for sore throat.   Respiratory: Negative for cough.  Gastrointestinal: Negative for constipation, diarrhea, nausea and vomiting.  Genitourinary: Negative for dysuria and frequency.  Musculoskeletal: Positive for arthralgias and myalgias.  Skin: Negative for rash and wound.  Allergic/Immunologic: Negative for immunocompromised state.  Neurological: Positive for headaches.  Psychiatric/Behavioral: Negative for confusion.  All other systems reviewed and are negative.    Physical Exam Updated Vital Signs BP 117/64   Pulse 68   Temp 98.2 F (36.8 C)   Resp 18   Ht 5\' 4"  (1.626 m)   Wt 47.4 kg   SpO2 96%   BMI 17.94 kg/m   Physical Exam Vitals signs and nursing note reviewed.   Constitutional:      General: He is not in acute distress.    Appearance: He is well-developed. He is not diaphoretic.  HENT:     Head: Normocephalic and atraumatic.  Neck:     Musculoskeletal: Neck supple.  Cardiovascular:     Rate and Rhythm: Normal rate and regular rhythm.     Pulses: Normal pulses.     Heart sounds: Normal heart sounds.  Pulmonary:     Effort: Pulmonary effort is normal.     Breath sounds: Normal breath sounds.  Abdominal:     General: There is no distension.     Palpations: Abdomen is soft.     Tenderness: There is no abdominal tenderness.  Musculoskeletal:     Right lower leg: No edema.     Left lower leg: No edema.  Lymphadenopathy:     Cervical: No cervical adenopathy.  Skin:    General: Skin is warm and dry.     Findings: No erythema or rash.  Neurological:     Mental Status: He is alert and oriented to person, place, and time.  Psychiatric:        Behavior: Behavior normal.      ED Treatments / Results  Labs (all labs ordered are listed, but only abnormal results are displayed) Labs Reviewed  NOVEL CORONAVIRUS, NAA (HOSP ORDER, SEND-OUT TO REF LAB; TAT 18-24 HRS)    EKG None  Radiology No results found.  Procedures Procedures (including critical care time)  Medications Ordered in ED Medications - No data to display   Initial Impression / Assessment and Plan / ED Course  I have reviewed the triage vital signs and the nursing notes.  Pertinent labs & imaging results that were available during my care of the patient were reviewed by me and considered in my medical decision making (see chart for details).  Clinical Course as of Oct 18 1205  Thu Oct 19, 2019  [id number] 33 year old male with report of fever with body aches and headache and mild congestion.  Its max temp of 100 yesterday, ongoing chills at this time.  No known sick contacts.  Patient requesting note for work.  Patient agreeable to Covid testing, advised he may return to  work if he is afebrile for 72 hours with a negative Covid test and feeling better.  If not improving consider repeat test at drive-through facility.   [LM]    Clinical Course User Index [LM] 38, PA-C      Final Clinical Impressions(s) / ED Diagnoses   Final diagnoses:  Viral upper respiratory tract infection    ED Discharge Orders    None       Jeannie Fend, PA-C 10/19/19 1206    10/21/19, MD 10/19/19 1745

## 2019-10-19 NOTE — ED Triage Notes (Signed)
C/o body aches and states he had a fever last night, states he needs a note for work

## 2019-10-21 LAB — NOVEL CORONAVIRUS, NAA (HOSP ORDER, SEND-OUT TO REF LAB; TAT 18-24 HRS): SARS-CoV-2, NAA: NOT DETECTED

## 2019-10-26 ENCOUNTER — Other Ambulatory Visit: Payer: Self-pay

## 2019-10-26 ENCOUNTER — Emergency Department (HOSPITAL_COMMUNITY)
Admission: EM | Admit: 2019-10-26 | Discharge: 2019-10-26 | Disposition: A | Discharge status: Home / Self Care | Payer: Self-pay | Attending: Emergency Medicine | Admitting: Emergency Medicine

## 2019-10-26 ENCOUNTER — Emergency Department (HOSPITAL_COMMUNITY): Payer: Self-pay

## 2019-10-26 ENCOUNTER — Encounter (HOSPITAL_COMMUNITY): Payer: Self-pay

## 2019-10-26 DIAGNOSIS — F17290 Nicotine dependence, other tobacco product, uncomplicated: Secondary | ICD-10-CM | POA: Insufficient documentation

## 2019-10-26 DIAGNOSIS — Z79899 Other long term (current) drug therapy: Secondary | ICD-10-CM | POA: Insufficient documentation

## 2019-10-26 DIAGNOSIS — R0781 Pleurodynia: Secondary | ICD-10-CM | POA: Insufficient documentation

## 2019-10-26 DIAGNOSIS — M62830 Muscle spasm of back: Secondary | ICD-10-CM | POA: Insufficient documentation

## 2019-10-26 MED ORDER — ACETAMINOPHEN 325 MG PO TABS: 650.0000 mg | ORAL_TABLET | Freq: Four times a day (QID) | ORAL | 0 refills | Status: DC | PRN

## 2019-10-26 MED ORDER — LIDOCAINE 5 % EX PTCH: 1.0000 | MEDICATED_PATCH | CUTANEOUS | 0 refills | Status: DC

## 2019-10-26 MED ORDER — IBUPROFEN 800 MG PO TABS
800.0000 mg | ORAL_TABLET | Freq: Once | ORAL | Status: DC
  Filled 2019-10-26: qty 1

## 2019-10-26 NOTE — ED Triage Notes (Signed)
Pt reports that he has pain with breathing in. Pain started yesterday after work Pt reports he has been coughing.Pt also reports negative covid on monday

## 2019-10-26 NOTE — Discharge Instructions (Addendum)
Your muscles are very tender on exam.  It is possible that you have a spasm or an injury to your back.  I recommended that you rest for the next 48 hours, take Tylenol as needed for pain.  I prescribed you lidocaine patches, which are numbing patches you can place in your back where it hurts.  You can also use icy hot or BenGay on your back.  You can also apply heating packs to your back.  Separate from this, I also recommended that you always wear a respirator mask when at work.  You may be inhaling potentially toxic chemicals or metal shards into your lungs, which can lead to long-term lung damage.  Your x-ray today did not show any signs of active infection.  If you have worsening difficulty with breathing, or significantly worsening pain, you should return to the emergency department immediately.

## 2019-10-26 NOTE — ED Provider Notes (Signed)
[facility] EMERGENCY DEPARTMENT Provider Note   CSN: [id number] Arrival date & time: 10/26/19  0805     History   Chief Complaint Chief Complaint  Patient presents with  . pain with breathing    HPI Jason Morton is a 33 y.o. male history ADHD and depression presented to emergency department pleuritic left-sided pain.  He reports a sharp pain that developed suddenly and is back along the left side of his spine, which radiates up and down his back in a vertical line, beginning yesterday while at work.  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  He states the pain is pleuritic and worse with inspiration.  He denies any cough or shortness of breath or fevers.  He states he had a viral URI about a month ago and is tested negative for Covid at that time.  He denies any medical problems aside from anxiety and ADHD.  He does report that he works as a Nature conservation officer at [employer], and rarely wears a respirator mask that is offered to him.  He says he is "covered in metal file shavings".  No hemoptysis or asymmetric LE edema. Patient denies personal or family history of DVT or PE. No recent hormone use (including OCP); travel for >6 hours; prolonged immobilization for greater than 3 days; surgeries or trauma in the last 4 weeks; or malignancy with treatment within 6 months.     HPI  Past Medical History:  Diagnosis Date  . ADHD   . Anxiety   . Depression     There are no active problems to display for this patient.   Past Surgical History:  Procedure Laterality Date  . arm surgery Left         Home Medications    Prior to Admission medications   Medication Sig Start Date End Date Taking? Authorizing Provider  acetaminophen (TYLENOL) 325 MG tablet Take 2 tablets (650 mg total) by mouth every 6 (six) hours as needed for up to 30 doses for mild pain or moderate pain. 10/26/19   Terald Sleeper, MD  albuterol (PROVENTIL  HFA;VENTOLIN HFA) 108 (90 Base) MCG/ACT inhaler Inhale 2 puffs into the lungs every 6 (six) hours as needed for wheezing or shortness of breath. 12/28/18   Fisher, Roselyn Bering, PA-C  benzonatate (TESSALON) 200 MG capsule Take 1 capsule (200 mg total) by mouth 3 (three) times daily as needed for cough. 12/28/18   Fisher, Roselyn Bering, PA-C  clindamycin (CLEOCIN) 150 MG capsule Take 2 capsules (300 mg total) by mouth 4 (four) times daily. For 7 days 12/04/16   Pauline Aus, PA-C  diclofenac (VOLTAREN) 75 MG EC tablet Take 1 tablet (75 mg total) by mouth 2 (two) times daily. Take with food 12/04/16   Triplett, Tammy, PA-C  lidocaine (LIDODERM) 5 % Place 1 patch onto the skin daily. Remove & Discard patch within 12 hours or as directed by MD 10/26/19   Terald Sleeper, MD  ondansetron (ZOFRAN ODT) 4 MG disintegrating tablet Take 1 tablet (4 mg total) by mouth every 8 (eight) hours as needed for nausea. 09/05/19   Eber Hong, MD  oseltamivir (TAMIFLU) 75 MG capsule Take 1 capsule (75 mg total) by mouth 2 (two) times daily. 12/28/18   Fisher, Roselyn Bering, PA-C  traMADol (ULTRAM) 50 MG tablet Take 1 tablet (50 mg total) by mouth every 6 (six) hours as needed. 10/01/15   Floyce Stakes,  Marijo Conception, PA-C    Family History No family history on file.  Social History Social History   Tobacco Use  . Smoking status: Current Every Day Smoker    Types: E-cigarettes  . Smokeless tobacco: Current User    Types: Chew  Substance Use Topics  . Alcohol use: Yes  . Drug use: No     Allergies   Patient has no known allergies.   Review of Systems Review of Systems  Constitutional: Negative for chills and fever.  Respiratory: Negative for cough and shortness of breath.   Cardiovascular: Positive for chest pain. Negative for palpitations.  Gastrointestinal: Negative for abdominal pain and vomiting.  Musculoskeletal: Positive for back pain and myalgias.  Skin: Negative for pallor and rash.  Neurological: Negative for syncope  and light-headedness.  All other systems reviewed and are negative.    Physical Exam Updated Vital Signs BP 119/80 (BP Location: Right Arm)   Pulse 73   Temp 98.3 F (36.8 C) (Oral)   Resp 18   SpO2 97%   Physical Exam Vitals signs and nursing note reviewed.  Constitutional:      Appearance: He is well-developed.  HENT:     Head: Normocephalic and atraumatic.  Eyes:     Conjunctiva/sclera: Conjunctivae normal.  Neck:     Musculoskeletal: Neck supple.  Cardiovascular:     Rate and Rhythm: Normal rate and regular rhythm.  Pulmonary:     Effort: Pulmonary effort is normal. No respiratory distress.     Breath sounds: Normal breath sounds.     Comments: 100% on room air Musculoskeletal:     Comments: Paraspinal focal muscle tenderness on exam, left sided No focal ribline point tenderness  Skin:    General: Skin is warm and dry.  Neurological:     Mental Status: He is alert.  Psychiatric:        Mood and Affect: Mood normal.        Behavior: Behavior normal.      ED Treatments / Results  Labs (all labs ordered are listed, but only abnormal results are displayed) Labs Reviewed - No data to display  EKG None  Radiology Dg Chest 1 View  Result Date: 10/26/2019 CLINICAL DATA:  Chest pain EXAM: CHEST  1 VIEW COMPARISON:  December 28, 2018. FINDINGS: Lungs are clear. Heart size and pulmonary vascularity are normal. No adenopathy. No pneumothorax. No bone lesions. IMPRESSION: No edema or consolidation.  Stable cardiac silhouette. Electronically Signed   By: Lowella Grip III M.D.   On: 10/26/2019 09:34    Procedures Procedures (including critical care time)  Medications Ordered in ED Medications - No data to display   Initial Impression / Assessment and Plan / ED Course  I have reviewed the triage vital signs and the nursing notes.  Pertinent labs & imaging results that were available during my care of the patient were reviewed by me and considered in my  medical decision making (see chart for details).  33 year old male presenting to emergency department sudden onset pleuritic chest pain that began yesterday.  He is exquisitely tender on exam and his pain is reproducible with palpation of paraspinal muscles.  I think this is most likely related to muscle spasm or back pain.  However given that his pain is pleuritic, and obtain a chest x-ray to rule out pneumothorax.  He has no evidence of hypoxia or respiratory distress.  I have very low suspicion for acute coronary syndrome or pulmonary embolism.  He  is PERC negative.  I also strongly recommended that he wear his respirator mask when working around metal filings or anything that he could not be inhaling to his lungs.  Explained that this can certainly cause long-term damage to his lungs if he is not careful.  He verbalizes understanding.   Final Clinical Impressions(s) / ED Diagnoses   Final diagnoses:  Pleuritic pain  Paraspinal muscle spasm    ED Discharge Orders         Ordered    acetaminophen (TYLENOL) 325 MG tablet  Every 6 hours PRN     10/26/19 0951    lidocaine (LIDODERM) 5 %  Every 24 hours    Note to Pharmacy: If patient's insurance does not cover prescription, you can offer him alternative over-the-counter options such as 4% lidocaine cream.   10/26/19 0951           Terald Sleeperrifan, Caetano Oberhaus J, MD 10/26/19 1720

## 2019-11-09 ENCOUNTER — Other Ambulatory Visit: Payer: Self-pay

## 2019-11-09 DIAGNOSIS — Z20822 Contact with and (suspected) exposure to covid-19: Secondary | ICD-10-CM

## 2019-11-12 LAB — NOVEL CORONAVIRUS, NAA: SARS-CoV-2, NAA: NOT DETECTED

## 2019-11-30 ENCOUNTER — Other Ambulatory Visit: Payer: Self-pay

## 2019-11-30 ENCOUNTER — Emergency Department (HOSPITAL_COMMUNITY)
Admission: EM | Admit: 2019-11-30 | Discharge: 2019-11-30 | Disposition: A | Discharge status: Home / Self Care | Payer: Managed Care, Other (non HMO) | Attending: Emergency Medicine | Admitting: Emergency Medicine

## 2019-11-30 ENCOUNTER — Encounter (HOSPITAL_COMMUNITY): Payer: Self-pay | Admitting: *Deleted

## 2019-11-30 DIAGNOSIS — Z20828 Contact with and (suspected) exposure to other viral communicable diseases: Secondary | ICD-10-CM | POA: Insufficient documentation

## 2019-11-30 DIAGNOSIS — M7918 Myalgia, other site: Secondary | ICD-10-CM | POA: Diagnosis not present

## 2019-11-30 DIAGNOSIS — F17220 Nicotine dependence, chewing tobacco, uncomplicated: Secondary | ICD-10-CM | POA: Diagnosis not present

## 2019-11-30 DIAGNOSIS — R519 Headache, unspecified: Secondary | ICD-10-CM | POA: Diagnosis present

## 2019-11-30 DIAGNOSIS — F17290 Nicotine dependence, other tobacco product, uncomplicated: Secondary | ICD-10-CM | POA: Insufficient documentation

## 2019-11-30 DIAGNOSIS — Z79899 Other long term (current) drug therapy: Secondary | ICD-10-CM | POA: Insufficient documentation

## 2019-11-30 DIAGNOSIS — Z20822 Contact with and (suspected) exposure to covid-19: Secondary | ICD-10-CM

## 2019-11-30 DIAGNOSIS — R0981 Nasal congestion: Secondary | ICD-10-CM | POA: Insufficient documentation

## 2019-11-30 LAB — SARS CORONAVIRUS 2 (TAT 6-24 HRS): SARS Coronavirus 2: NEGATIVE

## 2019-11-30 MED ORDER — BENZONATATE 100 MG PO CAPS: 100.0000 mg | ORAL_CAPSULE | Freq: Three times a day (TID) | ORAL | 0 refills | Status: DC

## 2019-11-30 MED ORDER — FLUTICASONE PROPIONATE 50 MCG/ACT NA SUSP: 2.0000 | Freq: Every day | NASAL | 0 refills | Status: DC

## 2019-11-30 MED ORDER — ACETAMINOPHEN 325 MG PO TABS: 650.0000 mg | ORAL_TABLET | Freq: Once | ORAL | Status: DC

## 2019-11-30 NOTE — ED Provider Notes (Signed)
[facility] EMERGENCY DEPARTMENT Provider Note   CSN: [id number] Arrival date & time: 11/30/19  9379    History Chief Complaint  Patient presents with   Nasal Congestion    Jason Morton is a 33 y.o. male with past medical history significant for anxiety, depression who presents for evaluation of upper respiratory symptoms.  Patient states he has had mild headache, congestion, body aches and pains, nonproductive cough, rhinorrhea, scratchy throat since yesterday evening.  States he did have 2 family members who recently tested positive for COVID-19 as well as a coworker who just returned from leave after having COVID-19.  He denies any sudden onset thunderclap headache, dizziness, lightheadedness, vision changes, eye pain, neck pain, neck stiffness, sore throat, chest pain, shortness of breath, hemoptysis, abdominal pain, diarrhea, constipation, unilateral weakness, paresthesias to extremities, fever, chills, nausea, vomiting.  He has not taken anything for his symptoms.  Patient states "I just will be checked out make sure to have Covid."  States headache started gradually yesterday.  Does have history of headaches and states this feels similar.  Headache not worse with position changes.  No recent trauma or injury or spinal infusions.  Denies additional aggravating or alleviating factors he has been able to tolerate p.o. intake at home without difficulty.  History obtained from patient and past medical records.  No interpreter is used.  HPI     Past Medical History:  Diagnosis Date   ADHD    Anxiety    Depression     There are no problems to display for this patient.   Past Surgical History:  Procedure Laterality Date   arm surgery Left        History reviewed. No pertinent family history.  Social History   Tobacco Use   Smoking status: Current Every Day Smoker    Types: E-cigarettes   Smokeless tobacco: Current User    Types: Chew  Substance Use Topics    Alcohol use: Yes   Drug use: No    Home Medications Prior to Admission medications   Medication Sig Start Date End Date Taking? Authorizing Provider  acetaminophen (TYLENOL) 325 MG tablet Take 2 tablets (650 mg total) by mouth every 6 (six) hours as needed for up to 30 doses for mild pain or moderate pain. 10/26/19   Wyvonnia Dusky, MD  albuterol (PROVENTIL HFA;VENTOLIN HFA) 108 (90 Base) MCG/ACT inhaler Inhale 2 puffs into the lungs every 6 (six) hours as needed for wheezing or shortness of breath. 12/28/18   Fisher, Linden Dolin, PA-C  benzonatate (TESSALON) 100 MG capsule Take 1 capsule (100 mg total) by mouth every 8 (eight) hours. 11/30/19   Tharon Bomar A, PA-C  clindamycin (CLEOCIN) 150 MG capsule Take 2 capsules (300 mg total) by mouth 4 (four) times daily. For 7 days 12/04/16   Kem Parkinson, PA-C  diclofenac (VOLTAREN) 75 MG EC tablet Take 1 tablet (75 mg total) by mouth 2 (two) times daily. Take with food 12/04/16   Triplett, Tammy, PA-C  fluticasone (FLONASE) 50 MCG/ACT nasal spray Place 2 sprays into both nostrils daily. 11/30/19   Rechelle Niebla A, PA-C  lidocaine (LIDODERM) 5 % Place 1 patch onto the skin daily. Remove & Discard patch within 12 hours or as directed by MD 10/26/19   Wyvonnia Dusky, MD  ondansetron (ZOFRAN ODT) 4 MG disintegrating tablet Take 1 tablet (4 mg total) by mouth every 8 (eight) hours as needed for nausea. 09/05/19   Noemi Chapel, MD  oseltamivir (  TAMIFLU) 75 MG capsule Take 1 capsule (75 mg total) by mouth 2 (two) times daily. 12/28/18   Fisher, Roselyn Bering, PA-C  traMADol (ULTRAM) 50 MG tablet Take 1 tablet (50 mg total) by mouth every 6 (six) hours as needed. 10/01/15   Evon Slack, PA-C    Allergies    Patient has no known allergies.  Review of Systems   Review of Systems  Constitutional: Negative.   HENT: Positive for congestion, postnasal drip, rhinorrhea, sneezing and sore throat (Scratchy throat). Negative for drooling, ear discharge, ear  pain, facial swelling, sinus pressure, sinus pain, trouble swallowing and voice change.   Eyes: Negative.   Respiratory: Positive for cough. Negative for choking, chest tightness, shortness of breath, wheezing and stridor.   Cardiovascular: Negative.   Gastrointestinal: Negative.   Genitourinary: Negative.   Musculoskeletal: Negative.   Skin: Negative.   Neurological: Positive for headaches. Negative for dizziness, tremors, seizures, syncope, facial asymmetry, speech difficulty, weakness, light-headedness and numbness.  All other systems reviewed and are negative.   Physical Exam Updated Vital Signs BP 119/64 (BP Location: Right Arm)    Pulse 100    Temp 98.7 F (37.1 C) (Oral)    Resp 15    Ht  (1.651 m)    Wt 50.8 kg    SpO2 99%    BMI 18.64 kg/m   Physical Exam Vitals and nursing note reviewed.  Constitutional:      General: He is not in acute distress.    Appearance: He is not ill-appearing, toxic-appearing or diaphoretic.  HENT:     Head: Normocephalic and atraumatic.     Jaw: There is normal jaw occlusion.     Right Ear: Tympanic membrane, ear canal and external ear normal. There is no impacted cerumen. No hemotympanum. Tympanic membrane is not injected, scarred, perforated, erythematous, retracted or bulging.     Left Ear: Tympanic membrane, ear canal and external ear normal. There is no impacted cerumen. No hemotympanum. Tympanic membrane is not injected, scarred, perforated, erythematous, retracted or bulging.     Ears:     Comments: No Mastoid tenderness.    Nose:     Comments: Clear rhinorrhea and congestion to bilateral nares.  No sinus tenderness.    Mouth/Throat:     Comments: Posterior oropharynx clear.  Mucous membranes moist.  Tonsils without erythema or exudate.  Uvula midline without deviation.  No evidence of PTA or RPA.  No drooling, dysphasia or trismus.  Phonation normal. Eyes:     Comments: No horizontal, vertical or rotational nystagmus   Neck:      Trachea: Trachea and phonation normal.     Meningeal: Brudzinski's sign and Kernig's sign absent.     Comments: No Neck stiffness or neck rigidity.  No meningismus.  No cervical lymphadenopathy. Cardiovascular:     Comments: No murmurs rubs or gallops. Pulmonary:     Comments: Clear to auscultation bilaterally without wheeze, rhonchi or rales.  No accessory muscle usage.  Able speak in full sentences. Abdominal:     Comments: Soft, nontender without rebound or guarding.  No CVA tenderness.  Musculoskeletal:     Comments: Moves all 4 extremities without difficulty.  Lower extremities without edema, erythema or warmth.  Skin:    Comments: Brisk capillary refill.  No rashes or lesions.  Neurological:     Mental Status: He is alert.     Comments: Mental Status:  Alert, oriented, thought content appropriate. Speech fluent without evidence of aphasia.  Able to follow 2 step commands without difficulty.  Cranial Nerves:  II:  Peripheral visual fields grossly normal, pupils equal, round, reactive to light III,IV, VI: ptosis not present, extra-ocular motions intact bilaterally  V,VII: smile symmetric, facial light touch sensation equal VIII: hearing grossly normal bilaterally  IX,X: midline uvula rise  XI: bilateral shoulder shrug equal and strong XII: midline tongue extension  Motor:  5/5 in upper and lower extremities bilaterally including strong and equal grip strength and dorsiflexion/plantar flexion Sensory: Pinprick and light touch normal in all extremities.  Deep Tendon Reflexes: 2+ and symmetric  Cerebellar: normal finger-to-nose with bilateral upper extremities Gait: normal gait and balance CV: distal pulses palpable throughout       ED Results / Procedures / Treatments   Labs (all labs ordered are listed, but only abnormal results are displayed) Labs Reviewed  SARS CORONAVIRUS 2 (TAT 6-24 HRS)    EKG None  Radiology No results found.  Procedures Procedures (including  critical care time)  Medications Ordered in ED Medications  acetaminophen (TYLENOL) tablet 650 mg (650 mg Oral Refused 11/30/19 0759)   ED Course  I have reviewed the triage vital signs and the nursing notes.  Pertinent labs & imaging results that were available during my care of the patient were reviewed by me and considered in my medical decision making (see chart for details).  33 year old male appears otherwise well presents for evaluation of upper respiratory symptoms.  He is afebrile, nonseptic, not ill-appearing.  He has a nonfocal neurologic exam without deficits.  Does have headache however denies sudden onset thunderclap headache, vision changes, eye pain, neck stiffness or neck rigidity.  He has no meningismus.  Refused Tylenol for pain.  He does have some clear rhinorrhea to bilateral nares however no sinus pressure, facial crepitus, purulent rhinorrhea.  Heart and lungs clear.  Nonproductive cough as well as body aches and pains.  He appears overall well however does have some known Covid exposures in family members. Will treat symptomatically, tested for Covid and have him follow-up if symptoms worsen.  It is very possible he has sinusitis however given symptoms less than 24 hours, lack of purulent rhinorrhea, high suspicion for viral etiology versus bacterial at this time.  Do not think he needs antibiotics at this time.  Scratchy throat however no evidence of PTA or RPA.  Posterior oropharynx clear.  Uvula midline without deviation.  Sublingual area soft. No facial swelling. Tolerating p.o. intake.  Discussed strict return precautions with patient.  Patient voiced understanding and is agreeable for follow-up.  The patient has been appropriately medically screened and/or stabilized in the ED. I have low suspicion for any other emergent medical condition which would require further screening, evaluation or treatment in the ED or require inpatient management.   Cindi Carbon was  evaluated in Emergency Department on 11/30/2019 for the symptoms described in the history of present illness. He was evaluated in the context of the global COVID-19 pandemic, which necessitated consideration that the patient might be at risk for infection with the SARS-CoV-2 virus that causes COVID-19. Institutional protocols and algorithms that pertain to the evaluation of patients at risk for COVID-19 are in a state of rapid change based on information released by regulatory bodies including the CDC and federal and state organizations. These policies and algorithms were followed during the patient's care in the ED.    MDM Rules/Calculators/A&P       Final Clinical Impression(s) / ED Diagnoses Final diagnoses:  Nasal  congestion  COVID-19 virus test result unknown    Rx / DC Orders ED Discharge Orders         Ordered    fluticasone (FLONASE) 50 MCG/ACT nasal spray  Daily     11/30/19 0750    benzonatate (TESSALON) 100 MG capsule  Every 8 hours     11/30/19 0750           Grier Czerwinski A, PA-C 11/30/19 0802    Blane OharaZavitz, Joshua, MD 12/03/19 [phone]

## 2019-11-30 NOTE — Discharge Instructions (Signed)
Take the medications as prescribed.  You will need to be out of work until your Covid test has resulted.  If positive you will need to be out of work for 10 days from first date of symptom onset.  If you develop sudden onset thunderclap headache, chest pain, shortness of breath, coughing up blood please seek reevaluation the emergency department.  May take Tylenol and ibuprofen as needed for your symptoms.  Do not take more than 4000 mg Tylenol or more than 2400 mg ibuprofen in a 24-hour.

## 2019-11-30 NOTE — ED Triage Notes (Signed)
Patient presents to the ED for coughing, sneezing, body aches since last night.  Patient states he has had a fever but hasn't measured it.  Patient has not tried any OTC remedies.

## 2019-12-18 ENCOUNTER — Other Ambulatory Visit: Payer: Self-pay

## 2019-12-18 ENCOUNTER — Emergency Department (HOSPITAL_COMMUNITY)
Admission: EM | Admit: 2019-12-18 | Discharge: 2019-12-18 | Disposition: A | Discharge status: Home / Self Care | Payer: Managed Care, Other (non HMO) | Attending: Emergency Medicine | Admitting: Emergency Medicine

## 2019-12-18 DIAGNOSIS — Z79899 Other long term (current) drug therapy: Secondary | ICD-10-CM | POA: Diagnosis not present

## 2019-12-18 DIAGNOSIS — H169 Unspecified keratitis: Secondary | ICD-10-CM | POA: Diagnosis not present

## 2019-12-18 DIAGNOSIS — H5712 Ocular pain, left eye: Secondary | ICD-10-CM | POA: Diagnosis present

## 2019-12-18 DIAGNOSIS — H1032 Unspecified acute conjunctivitis, left eye: Secondary | ICD-10-CM | POA: Diagnosis not present

## 2019-12-18 DIAGNOSIS — F17290 Nicotine dependence, other tobacco product, uncomplicated: Secondary | ICD-10-CM | POA: Diagnosis not present

## 2019-12-18 MED ORDER — TETRACAINE HCL 0.5 % OP SOLN
1.0000 [drp] | Freq: Once | OPHTHALMIC | Status: AC
  Administered 2019-12-18: 1 [drp] via OPHTHALMIC

## 2019-12-18 MED ORDER — OFLOXACIN 0.3 % OP SOLN: 1.0000 [drp] | Freq: Four times a day (QID) | OPHTHALMIC | 0 refills | Status: AC

## 2019-12-18 MED ORDER — FLUORESCEIN SODIUM 1 MG OP STRP
1.0000 | ORAL_STRIP | Freq: Once | OPHTHALMIC | Status: AC
  Administered 2019-12-18: 1 via OPHTHALMIC

## 2019-12-18 NOTE — ED Triage Notes (Signed)
Pt c/o eye irritation. Mainly his left eye. But both have been bothering him x3 days. 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 Normally wears contacts. Woke up this am with his eye worse. Said that his vision hasn't changed from what it normally is. Just slightly blurry from irritation.

## 2019-12-18 NOTE — ED Provider Notes (Signed)
[facility] EMERGENCY DEPARTMENT Provider Note   CSN: [id number] Arrival date & time: 12/18/19  0541     History Chief Complaint  Patient presents with  . Eye Problem    Jason Morton is a 33 y.o. male.  HPI     33 year old male comes in a chief complaint of eye pain.  Patient reports that over the weekend he started noticing some discomfort in his left eye.  This morning he woke up with severe crusting with inability to open his eyes.  He has yellowish discharge.  He is also light sensitivity.  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  Patient is not wearing contacts right now and states that his vision is worse, but he is unsure if it is because he does not have contacts on or there is an eye issue.  He does not have glasses.  Past Medical History:  Diagnosis Date  . ADHD   . Anxiety   . Depression     There are no problems to display for this patient.   Past Surgical History:  Procedure Laterality Date  . arm surgery Left        No family history on file.  Social History   Tobacco Use  . Smoking status: Current Every Day Smoker    Types: E-cigarettes  . Smokeless tobacco: Current User    Types: Chew  Substance Use Topics  . Alcohol use: Yes  . Drug use: No    Home Medications Prior to Admission medications   Medication Sig Start Date End Date Taking? Authorizing Provider  acetaminophen (TYLENOL) 325 MG tablet Take 2 tablets (650 mg total) by mouth every 6 (six) hours as needed for up to 30 doses for mild pain or moderate pain. 10/26/19   Terald Sleeper, MD  albuterol (PROVENTIL HFA;VENTOLIN HFA) 108 (90 Base) MCG/ACT inhaler Inhale 2 puffs into the lungs every 6 (six) hours as needed for wheezing or shortness of breath. 12/28/18   Fisher, Roselyn Bering, PA-C  benzonatate (TESSALON) 100 MG capsule Take 1 capsule (100 mg total) by mouth every 8 (eight) hours. 11/30/19   Henderly, Britni  A, PA-C  clindamycin (CLEOCIN) 150 MG capsule Take 2 capsules (300 mg total) by mouth 4 (four) times daily. For 7 days 12/04/16   Pauline Aus, PA-C  diclofenac (VOLTAREN) 75 MG EC tablet Take 1 tablet (75 mg total) by mouth 2 (two) times daily. Take with food 12/04/16   Triplett, Tammy, PA-C  fluticasone (FLONASE) 50 MCG/ACT nasal spray Place 2 sprays into both nostrils daily. 11/30/19   Henderly, Britni A, PA-C  lidocaine (LIDODERM) 5 % Place 1 patch onto the skin daily. Remove & Discard patch within 12 hours or as directed by MD 10/26/19   Terald Sleeper, MD  ondansetron (ZOFRAN ODT) 4 MG disintegrating tablet Take 1 tablet (4 mg total) by mouth every 8 (eight) hours as needed for nausea. 09/05/19   Eber Hong, MD  oseltamivir (TAMIFLU) 75 MG capsule Take 1 capsule (75 mg total) by mouth 2 (two) times daily. 12/28/18   Fisher, Roselyn Bering, PA-C  traMADol (ULTRAM) 50 MG tablet Take 1 tablet (50 mg total) by mouth every 6 (six) hours as needed. 10/01/15   Evon Slack, PA-C    Allergies    Patient has no known allergies.  Review of Systems   Review of Systems  Constitutional: Positive for activity  change.  HENT: Negative for postnasal drip, rhinorrhea, sinus pressure, sinus pain and sore throat.   Eyes: Positive for photophobia, redness and visual disturbance.  Allergic/Immunologic: Negative for immunocompromised state.    Physical Exam Updated Vital Signs BP 120/75   Pulse 74   Temp 98.1 F (36.7 C) (Oral)   Ht 5\' 5"  (1.651 m)   Wt 50.8 kg   SpO2 94%   BMI 18.64 kg/m   Physical Exam Constitutional:      Appearance: He is well-developed.  HENT:     Head: Normocephalic and atraumatic.  Eyes:     Extraocular Movements: Extraocular movements intact.     Pupils: Pupils are equal, round, and reactive to light.     Comments: Left Eye exam: EOMI, PERRL + photophobia Gross bedside visual acuity via snellen chart reveals no significant visual deficits with finger  counting. Eye lid eversion reveals no foreign body Pt has chemosis Fluorecin test under woods lamp reveals no uptake of dye    Cardiovascular:     Rate and Rhythm: Normal rate.     Pulses: Normal pulses.  Pulmonary:     Effort: Pulmonary effort is normal.     Breath sounds: Normal breath sounds.  Abdominal:     General: Bowel sounds are normal. There is no distension.     Palpations: Abdomen is soft.     Tenderness: There is no abdominal tenderness. There is no guarding or rebound.  Musculoskeletal:     Cervical back: Normal range of motion.  Skin:    General: Skin is warm.  Neurological:     Mental Status: He is alert and oriented to person, place, and time.     ED Results / Procedures / Treatments   Labs (all labs ordered are listed, but only abnormal results are displayed) Labs Reviewed - No data to display  EKG None  Radiology No results found.  Procedures Procedures (including critical care time)  Medications Ordered in ED Medications  tetracaine (PONTOCAINE) 0.5 % ophthalmic solution 1 drop (has no administration in time range)  fluorescein ophthalmic strip 1 strip (has no administration in time range)    ED Course  I have reviewed the triage vital signs and the nursing notes.  Pertinent labs & imaging results that were available during my care of the patient were reviewed by me and considered in my medical decision making (see chart for details).    MDM Rules/Calculators/A&P                      33 year old comes in a chief complaint of eye irritation. He has photophobia, questionable vision change, consensual photophobia, chemosis and drainage.  He is a contact lens user.  We do not appreciate any foreign body on exam.  It appears that he likely has keratitis/uveitis.  We will start him on fluoroquinolone eyedrop to cover Pseudomonas.  Patient advised to follow-up promptly with eye specialist. Strict ER return precautions discussed with  him.  Final Clinical Impression(s) / ED Diagnoses Final diagnoses:  Keratitis  Acute conjunctivitis of left eye, unspecified acute conjunctivitis type    Rx / DC Orders ED Discharge Orders    None       Varney Biles, MD 12/18/19 [phone]

## 2019-12-18 NOTE — Discharge Instructions (Signed)
We saw in the ER for eye irritation. You have infection in your eye and will need eyedrops.  Please do not wear any eye contacts until you have completely recovered.  Please follow-up with the eye specialist recommended in 2 to 3 days. If you are unable to drive to [facility], then we recommend that you follow-up with optometrist at [place] [phone]

## 2019-12-22 DIAGNOSIS — D649 Anemia, unspecified: Secondary | ICD-10-CM

## 2019-12-22 HISTORY — DX: Anemia, unspecified: D64.9

## 2019-12-28 ENCOUNTER — Emergency Department (HOSPITAL_COMMUNITY)
Admission: EM | Admit: 2019-12-28 | Discharge: 2019-12-28 | Disposition: A | Discharge status: Home / Self Care | Payer: Managed Care, Other (non HMO) | Attending: Emergency Medicine | Admitting: Emergency Medicine

## 2019-12-28 ENCOUNTER — Encounter (HOSPITAL_COMMUNITY): Payer: Self-pay

## 2019-12-28 ENCOUNTER — Other Ambulatory Visit: Payer: Self-pay

## 2019-12-28 DIAGNOSIS — Z79899 Other long term (current) drug therapy: Secondary | ICD-10-CM | POA: Insufficient documentation

## 2019-12-28 DIAGNOSIS — F17290 Nicotine dependence, other tobacco product, uncomplicated: Secondary | ICD-10-CM | POA: Insufficient documentation

## 2019-12-28 DIAGNOSIS — R112 Nausea with vomiting, unspecified: Secondary | ICD-10-CM | POA: Diagnosis present

## 2019-12-28 DIAGNOSIS — R109 Unspecified abdominal pain: Secondary | ICD-10-CM | POA: Diagnosis not present

## 2019-12-28 MED ORDER — ONDANSETRON 4 MG PO TBDP: 4.0000 mg | ORAL_TABLET | Freq: Three times a day (TID) | ORAL | 0 refills | Status: DC | PRN

## 2019-12-28 MED ORDER — ONDANSETRON 4 MG PO TBDP
4.0000 mg | ORAL_TABLET | Freq: Once | ORAL | Status: AC
  Administered 2019-12-28: 4 mg via ORAL
  Filled 2019-12-28: qty 1

## 2019-12-28 NOTE — ED Triage Notes (Addendum)
Pt has thrown up 3 times today. States he believes it's attributed to chicken that he ate last night. Only states he has abdominal pain with vomiting. NAD. States he wanted to go home and take nausea medicine, but his work told him he had to have a note. Pt does not want any blood work.

## 2019-12-28 NOTE — Discharge Instructions (Signed)
You may use your Pepto-Bismol as planned, however if you need any additional medication for nausea or ongoing vomiting you may pick up the prescription for Zofran that you have been prescribed.  I recommend a bland diet today in addition to increase fluids to prevent dehydration.  If your symptoms are resolved it is fine to return to work tomorrow.  Get rechecked for any persistent or escalating symptoms.

## 2019-12-28 NOTE — ED Provider Notes (Signed)
[facility] EMERGENCY DEPARTMENT Provider Note   CSN: [id number] Arrival date & time: 12/28/19  0102     History Chief Complaint  Patient presents with  . Emesis    Jason Morton is a 34 y.o. male presenting for evaluation of nausea and vomiting.  He describes 3 episodes of 9 bloody emesis occurring today while at work.  He suspects he ate improperly cooked chicken last night for dinner.  He reports upper abdominal pain associated with vomiting which is resolved between episodes of emesis.  He denies fevers or chills, myalgias, no diarrhea, denies weakness, also no cough, shortness of breath, loss of taste or smell.  He has had no known Covid exposures.  He has had no medications for symptoms prior to arrival.  Patient states he would not have come in, but his employer requires a work note in order for him to be able to return.  The history is provided by the patient.       Past Medical History:  Diagnosis Date  . ADHD   . Anxiety   . Depression     There are no problems to display for this patient.   Past Surgical History:  Procedure Laterality Date  . arm surgery Left        No family history on file.  Social History   Tobacco Use  . Smoking status: Current Every Day Smoker    Types: E-cigarettes  . Smokeless tobacco: Current User    Types: Chew  Substance Use Topics  . Alcohol use: Yes  . Drug use: No    Home Medications Prior to Admission medications   Medication Sig Start Date End Date Taking? Authorizing Provider  acetaminophen (TYLENOL) 325 MG tablet Take 2 tablets (650 mg total) by mouth every 6 (six) hours as needed for up to 30 doses for mild pain or moderate pain. 10/26/19   Terald Sleeper, MD  albuterol (PROVENTIL HFA;VENTOLIN HFA) 108 (90 Base) MCG/ACT inhaler Inhale 2 puffs into the lungs every 6 (six) hours as needed for wheezing or shortness of breath. 12/28/18   Fisher, Roselyn Bering, PA-C  benzonatate (TESSALON) 100 MG capsule Take 1 capsule (100  mg total) by mouth every 8 (eight) hours. 11/30/19   Henderly, Britni A, PA-C  clindamycin (CLEOCIN) 150 MG capsule Take 2 capsules (300 mg total) by mouth 4 (four) times daily. For 7 days 12/04/16   Pauline Aus, PA-C  diclofenac (VOLTAREN) 75 MG EC tablet Take 1 tablet (75 mg total) by mouth 2 (two) times daily. Take with food 12/04/16   Triplett, Tammy, PA-C  fluticasone (FLONASE) 50 MCG/ACT nasal spray Place 2 sprays into both nostrils daily. 11/30/19   Henderly, Britni A, PA-C  lidocaine (LIDODERM) 5 % Place 1 patch onto the skin daily. Remove & Discard patch within 12 hours or as directed by MD 10/26/19   Terald Sleeper, MD  ondansetron (ZOFRAN ODT) 4 MG disintegrating tablet Take 1 tablet (4 mg total) by mouth every 8 (eight) hours as needed for nausea or vomiting. 12/28/19   Burgess Amor, PA-C  oseltamivir (TAMIFLU) 75 MG capsule Take 1 capsule (75 mg total) by mouth 2 (two) times daily. 12/28/18   Fisher, Roselyn Bering, PA-C  traMADol (ULTRAM) 50 MG tablet Take 1 tablet (50 mg total) by mouth every 6 (six) hours as needed. 10/01/15   Evon Slack, PA-C    Allergies    Patient has no known allergies.  Review of Systems  Review of Systems  Constitutional: Negative for chills and fever.  HENT: Negative for congestion and sore throat.   Eyes: Negative.   Respiratory: Negative for chest tightness and shortness of breath.   Cardiovascular: Negative for chest pain.  Gastrointestinal: Positive for abdominal pain, nausea and vomiting. Negative for diarrhea.  Genitourinary: Negative.  Negative for dysuria.  Musculoskeletal: Negative for arthralgias, joint swelling and neck pain.  Skin: Negative.  Negative for rash and wound.  Neurological: Negative for dizziness, weakness, light-headedness, numbness and headaches.  Psychiatric/Behavioral: Negative.     Physical Exam Updated Vital Signs BP 123/71 (BP Location: Right Arm)   Pulse 72   Temp 98.2 F (36.8 C) (Oral)   Resp 16   SpO2 96%     Physical Exam Vitals and nursing note reviewed.  Constitutional:      Appearance: He is well-developed.  HENT:     Head: Normocephalic and atraumatic.     Mouth/Throat:     Mouth: Mucous membranes are moist.  Eyes:     Conjunctiva/sclera: Conjunctivae normal.  Cardiovascular:     Rate and Rhythm: Normal rate and regular rhythm.     Heart sounds: Normal heart sounds.  Pulmonary:     Effort: Pulmonary effort is normal.     Breath sounds: Normal breath sounds. No wheezing.  Abdominal:     General: Bowel sounds are normal. There is no distension.     Palpations: Abdomen is soft.     Tenderness: There is no abdominal tenderness. There is no guarding.  Musculoskeletal:        General: Normal range of motion.     Cervical back: Normal range of motion.  Skin:    General: Skin is warm and dry.  Neurological:     Mental Status: He is alert.     ED Results / Procedures / Treatments   Labs (all labs ordered are listed, but only abnormal results are displayed) Labs Reviewed - No data to display  EKG None  Radiology No results found.  Procedures Procedures (including critical care time)  Medications Ordered in ED Medications  ondansetron (ZOFRAN-ODT) disintegrating tablet 4 mg (has no administration in time range)    ED Course  I have reviewed the triage vital signs and the nursing notes.  Pertinent labs & imaging results that were available during my care of the patient were reviewed by me and considered in my medical decision making (see chart for details).    MDM Rules/Calculators/A&P                      Pt presenting with 3 episodes of emesis, now with mild nausea, no abd jpain, normal exam including benign abd. no exam findings to suggest dehydration.  Slight increase in bowel sounds, but soft, non tender, no distention.  His vital signs are normal, afebrile, no indication for further work-up at this time.  He was given Zofran for as needed use if symptoms  persist.  Also given a work note for return tomorrow if his symptoms are resolved.  There is no symptoms suggesting Covid including no fevers, no respiratory complaints, no myalgias or other flulike symptoms. Final Clinical Impression(s) / ED Diagnoses Final diagnoses:  Non-intractable vomiting with nausea, unspecified vomiting type    Rx / DC Orders ED Discharge Orders         Ordered    ondansetron (ZOFRAN ODT) 4 MG disintegrating tablet  Every 8 hours PRN     12/28/19  [place], Mariusz Jubb, PA-C 12/28/19 0840    Hayden Rasmussen, MD 12/28/19 1736

## 2020-02-19 ENCOUNTER — Emergency Department (HOSPITAL_COMMUNITY)
Admission: EM | Admit: 2020-02-19 | Discharge: 2020-02-19 | Disposition: A | Discharge status: Home / Self Care | Payer: Managed Care, Other (non HMO) | Attending: Emergency Medicine | Admitting: Emergency Medicine

## 2020-02-19 ENCOUNTER — Encounter (HOSPITAL_COMMUNITY): Payer: Self-pay | Admitting: Emergency Medicine

## 2020-02-19 ENCOUNTER — Other Ambulatory Visit: Payer: Self-pay

## 2020-02-19 DIAGNOSIS — Z79899 Other long term (current) drug therapy: Secondary | ICD-10-CM | POA: Insufficient documentation

## 2020-02-19 DIAGNOSIS — H109 Unspecified conjunctivitis: Secondary | ICD-10-CM | POA: Insufficient documentation

## 2020-02-19 DIAGNOSIS — H1031 Unspecified acute conjunctivitis, right eye: Secondary | ICD-10-CM

## 2020-02-19 DIAGNOSIS — F909 Attention-deficit hyperactivity disorder, unspecified type: Secondary | ICD-10-CM | POA: Insufficient documentation

## 2020-02-19 DIAGNOSIS — H5713 Ocular pain, bilateral: Secondary | ICD-10-CM

## 2020-02-19 DIAGNOSIS — F17290 Nicotine dependence, other tobacco product, uncomplicated: Secondary | ICD-10-CM | POA: Insufficient documentation

## 2020-02-19 MED ORDER — CIPROFLOXACIN HCL 0.3 % OP OINT: TOPICAL_OINTMENT | OPHTHALMIC | 0 refills | Status: DC

## 2020-02-19 MED ORDER — TETRACAINE HCL 0.5 % OP SOLN
2.0000 [drp] | Freq: Once | OPHTHALMIC | Status: DC
  Filled 2020-02-19: qty 4

## 2020-02-19 MED ORDER — FLUORESCEIN SODIUM 1 MG OP STRP
2.0000 | ORAL_STRIP | Freq: Once | OPHTHALMIC | Status: DC
  Filled 2020-02-19: qty 2

## 2020-02-19 NOTE — ED Triage Notes (Signed)
Pt reports bilateral eye pain and difficulty opening them x 2 days. Wears contacts, not currently in eyes.

## 2020-02-19 NOTE — ED Provider Notes (Addendum)
[facility] EMERGENCY DEPARTMENT Provider Note   CSN: [id number] Arrival date & time: 02/19/20  3664     History Chief Complaint  Patient presents with  . Eye Pain    Jason Morton is a 34 y.o. male with has medical history significant for ADHD, anxiety, depression who presents for evaluation of eye pain.  Patient states he noticed some eye irritation which started 2 to 3 days ago.  Additionally in right eye and now in both eyes however worse in his right eye.  Has noticed some discharge.  States he does work around [employer] however [employer] and has not noticed anything going into his eyes.  Feels like he has some photophobia.  Does wear contacts or does not have them in currently.  Patient states he did put them in for a short period of time yesterday which did help with his pain however when he removes them he continued to have the pain.  He feels like his vision is worse however he is unsure if this is because he is not wearing his contacts.  He is not followed by ophthalmology.  Denies headache, lightheadedness, dizziness, neck pain, neck stiffness, facial pain.  He has not taken anything for his pain. Denies additional aggravating or alleviating factors. Denies swelling to eye lids, facial erythema, warmth, pain with EOM.  History obtained from patient and past medical records.  No interpreter is used.  HPI     Past Medical History:  Diagnosis Date  . ADHD   . Anxiety   . Depression     There are no problems to display for this patient.   Past Surgical History:  Procedure Laterality Date  . arm surgery Left        History reviewed. No pertinent family history.  Social History   Tobacco Use  . Smoking status: Current Every Day Smoker    Types: E-cigarettes  . Smokeless tobacco: Current User    Types: Chew  Substance Use Topics  . Alcohol use: Yes  . Drug use: No    Home Medications Prior to Admission medications   Medication Sig Start Date End Date  Taking? Authorizing Provider  acetaminophen (TYLENOL) 325 MG tablet Take 2 tablets (650 mg total) by mouth every 6 (six) hours as needed for up to 30 doses for mild pain or moderate pain. 10/26/19   Wyvonnia Dusky, MD  albuterol (PROVENTIL HFA;VENTOLIN HFA) 108 (90 Base) MCG/ACT inhaler Inhale 2 puffs into the lungs every 6 (six) hours as needed for wheezing or shortness of breath. 12/28/18   Fisher, Linden Dolin, PA-C  benzonatate (TESSALON) 100 MG capsule Take 1 capsule (100 mg total) by mouth every 8 (eight) hours. 11/30/19   Raenah Murley A, PA-C  ciprofloxacin (CILOXAN) 0.3 % ophthalmic ointment Place half in ribbon to bilateral eyes twice daily. 02/19/20   Navya Timmons A, PA-C  clindamycin (CLEOCIN) 150 MG capsule Take 2 capsules (300 mg total) by mouth 4 (four) times daily. For 7 days 12/04/16   Kem Parkinson, PA-C  diclofenac (VOLTAREN) 75 MG EC tablet Take 1 tablet (75 mg total) by mouth 2 (two) times daily. Take with food 12/04/16   Triplett, Tammy, PA-C  fluticasone (FLONASE) 50 MCG/ACT nasal spray Place 2 sprays into both nostrils daily. 11/30/19   Ruvim Risko A, PA-C  lidocaine (LIDODERM) 5 % Place 1 patch onto the skin daily. Remove & Discard patch within 12 hours or as directed by MD 10/26/19   Octaviano Glow  J, MD  ondansetron (ZOFRAN ODT) 4 MG disintegrating tablet Take 1 tablet (4 mg total) by mouth every 8 (eight) hours as needed for nausea or vomiting. 12/28/19   Burgess Amor, PA-C  oseltamivir (TAMIFLU) 75 MG capsule Take 1 capsule (75 mg total) by mouth 2 (two) times daily. 12/28/18   Fisher, Roselyn Bering, PA-C  traMADol (ULTRAM) 50 MG tablet Take 1 tablet (50 mg total) by mouth every 6 (six) hours as needed. 10/01/15   Evon Slack, PA-C    Allergies    Patient has no known allergies.  Review of Systems   Review of Systems  Constitutional: Negative.   HENT: Negative.   Eyes: Positive for photophobia, pain, discharge, redness and itching.  Respiratory: Negative.     Cardiovascular: Negative.   Gastrointestinal: Negative.   Genitourinary: Negative.   Musculoskeletal: Negative.   Skin: Negative.   Neurological: Negative.   All other systems reviewed and are negative.   Physical Exam Updated Vital Signs BP 127/76 (BP Location: Right Arm)   Pulse 76   Temp 98.1 F (36.7 C) (Oral)   Resp 18   Ht 5\' 5"  (1.651 m)   Wt 50.8 kg   SpO2 99%   BMI 18.64 kg/m   Physical Exam Vitals and nursing note reviewed.  Constitutional:      General: He is not in acute distress.    Appearance: He is well-developed. He is not ill-appearing, toxic-appearing or diaphoretic.  HENT:     Head: Normocephalic and atraumatic. No raccoon eyes, Battle's sign, abrasion, contusion, right periorbital erythema, left periorbital erythema or laceration.     Jaw: There is normal jaw occlusion.     Comments: No periorbital erythema, edema.  No tenderness over temporal region.    Nose: Nose normal.     Mouth/Throat:     Mouth: Mucous membranes are moist.  Eyes:     General: Lids are everted, no foreign bodies appreciated. Vision grossly intact.        Right eye: No foreign body, discharge or hordeolum.        Left eye: No foreign body, discharge or hordeolum.     Intraocular pressure: Right eye pressure is 15 mmHg. Left eye pressure is 14 mmHg.     Extraocular Movements: Extraocular movements intact.     Conjunctiva/sclera:     Right eye: Right conjunctiva is injected. Chemosis present. No exudate or hemorrhage.    Left eye: Left conjunctiva is injected. Chemosis present. No exudate or hemorrhage.    Pupils: Pupils are equal, round, and reactive to light.     Slit lamp exam:    Right eye: Anterior chamber quiet.     Left eye: Anterior chamber quiet.     Visual Fields: Right eye visual fields normal and left eye visual fields normal.     Comments: No foreign body.  Mild chemosis bilateral eyes with injected sclera.  Pupils equal reactive to light.  IOP's right 15, 15, 15,  IOP left 14, 15, 14.  Vision bilateral.  No visual field cuts.  No evidence of globe rupture.  No surrounding erythema, warmth, swelling to orbital region  Neck:     Comments: No neck stiffness or neck rigidity. Cardiovascular:     Rate and Rhythm: Normal rate and regular rhythm.     Pulses: Normal pulses.     Heart sounds: Normal heart sounds.  Pulmonary:     Effort: Pulmonary effort is normal. No respiratory distress.  Breath sounds: Normal breath sounds.  Abdominal:     General: Bowel sounds are normal. There is no distension.     Palpations: Abdomen is soft.  Musculoskeletal:        General: Normal range of motion.     Cervical back: Normal range of motion and neck supple.  Skin:    General: Skin is warm and dry.  Neurological:     Mental Status: He is alert.    ED Results / Procedures / Treatments   Labs (all labs ordered are listed, but only abnormal results are displayed) Labs Reviewed - No data to display  EKG None  Radiology No results found.  Procedures Procedures (including critical care time)  Medications Ordered in ED Medications  fluorescein ophthalmic strip 2 strip (has no administration in time range)  tetracaine (PONTOCAINE) 0.5 % ophthalmic solution 2 drop (has no administration in time range)    ED Course  I have reviewed the triage vital signs and the nursing notes.  Pertinent labs & imaging results that were available during my care of the patient were reviewed by me and considered in my medical decision making (see chart for details).  34 year old male appears otherwise well presents for evaluation of bilateral eye irritation, pain and discharge.  Afebrile, nonseptic, non-ill-appearing.  Does work around work however wears safety glasses and does not remember getting anything into his eyes.  Does wear contacts.  He feels like his vision has changed however he is unsure if this is because he has not been wearing his contacts.  No periorbital  erythema, warmth.  No pain with EOMs.  I have low suspicion for septal cellulitis.  Eye exam with injected sclera bilaterally.  No uptake on fluorescein stain.  No consensual photophobia.  He does have some drainage and some mild chemosis.  He does wear contact lenses.  No foreign body on exam.  No evidence of globe rupture.  Patient likely with some degree of conjunctivitis versus keratitis versus uveitis.  Will start on fluoroquinolone to cover for Pseudomonas given he is a contact lens wearer.  He is to follow-up with ophthalmology over the next few days.  Discussed return precautions.  Patient voiced understands agreeable follow-up.  Purulent discharge exam. No corneal abrasions, entrapment, consensual photophobia, or dendritic staining with fluorescein study.  No evidence of acute angle glaucoma. presentation non-concerning for  corneal abrasions, or HSV.  No evidence of preseptal or orbital cellulitis.  Patient will be given erythromycin ophthalmic.    Patient advised to followup with ophthalmologist for reevaluation in several days. Patient verbalizes understanding and is agreeable with discharge.  The patient has been appropriately medically screened and/or stabilized in the ED. I have low suspicion for any other emergent medical condition which would require further screening, evaluation or treatment in the ED or require inpatient management.  Patient is hemodynamically stable and in no acute distress.  Patient able to ambulate in department prior to ED.  Evaluation does not show acute pathology that would require ongoing or additional emergent interventions while in the emergency department or further inpatient treatment.  I have discussed the diagnosis with the patient and answered all questions.  Pain is been managed while in the emergency department and patient has no further complaints prior to discharge.  Patient is comfortable with plan discussed in room and is stable for discharge at this  time.  I have discussed strict return precautions for returning to the emergency department.  Patient was encouraged to follow-up  with PCP/specialist refer to at discharge.     MDM Rules/Calculators/A&P                       Final Clinical Impression(s) / ED Diagnoses Final diagnoses:  Pain of both eyes  Acute conjunctivitis of right eye, unspecified acute conjunctivitis type    Rx / DC Orders ED Discharge Orders         Ordered    ciprofloxacin (CILOXAN) 0.3 % ophthalmic ointment     02/19/20 1110           Elner Seifert A, PA-C 02/19/20 1112    Sharlett Lienemann A, PA-C 02/19/20 1112    Bethann Berkshire, MD 02/19/20 1345

## 2020-04-15 ENCOUNTER — Encounter (HOSPITAL_COMMUNITY): Payer: Self-pay

## 2020-04-15 ENCOUNTER — Emergency Department (HOSPITAL_COMMUNITY)
Admission: EM | Admit: 2020-04-15 | Discharge: 2020-04-15 | Disposition: A | Discharge status: Home / Self Care | Payer: Self-pay | Attending: Emergency Medicine | Admitting: Emergency Medicine

## 2020-04-15 ENCOUNTER — Other Ambulatory Visit: Payer: Self-pay

## 2020-04-15 DIAGNOSIS — F909 Attention-deficit hyperactivity disorder, unspecified type: Secondary | ICD-10-CM | POA: Insufficient documentation

## 2020-04-15 DIAGNOSIS — Z79899 Other long term (current) drug therapy: Secondary | ICD-10-CM | POA: Insufficient documentation

## 2020-04-15 DIAGNOSIS — F17290 Nicotine dependence, other tobacco product, uncomplicated: Secondary | ICD-10-CM | POA: Insufficient documentation

## 2020-04-15 DIAGNOSIS — J302 Other seasonal allergic rhinitis: Secondary | ICD-10-CM | POA: Insufficient documentation

## 2020-04-15 NOTE — ED Triage Notes (Signed)
Pt presents to ED with complaints of sinus congestion, runny nose, sneezing started 4 days ago. Pt states he has been taking Claritin. Pt states he needs a note for work.

## 2020-04-15 NOTE — ED Provider Notes (Signed)
[facility] EMERGENCY DEPARTMENT Provider Note   CSN: [id number] Arrival date & time: 04/15/20  0867     History Chief Complaint  Patient presents with  . Sinus Congestion    Jason Morton is a 34 y.o. male with PMHx anxiety and depression who presents to the ED today with complaint of nasal congestion, sneezing, and itching throat worse in the past 3 days. Pt reports hx of seasonal allergies however his symptoms have worsened 3 days ago. He has been taking Claritin for the past week without relief. Reports he has been changing medications around to find out what best works for him. He used to take Sudafed however reports it made him drowsy. Pt denies any recent sick contact or COVID 19 positive exposure. Denies fevers, sore throat, ear pain, chest pain, SOB, or any other associated symptoms.   The history is provided by the patient and medical records.       Past Medical History:  Diagnosis Date  . ADHD   . Anxiety   . Depression     There are no problems to display for this patient.   Past Surgical History:  Procedure Laterality Date  . arm surgery Left        No family history on file.  Social History   Tobacco Use  . Smoking status: Current Every Day Smoker    Types: E-cigarettes  . Smokeless tobacco: Current User    Types: Chew  Substance Use Topics  . Alcohol use: Yes  . Drug use: No    Home Medications Prior to Admission medications   Medication Sig Start Date End Date Taking? Authorizing Provider  acetaminophen (TYLENOL) 325 MG tablet Take 2 tablets (650 mg total) by mouth every 6 (six) hours as needed for up to 30 doses for mild pain or moderate pain. 10/26/19   Terald Sleeper, MD  albuterol (PROVENTIL HFA;VENTOLIN HFA) 108 (90 Base) MCG/ACT inhaler Inhale 2 puffs into the lungs every 6 (six) hours as needed for wheezing or shortness of breath. 12/28/18   Fisher, Roselyn Bering, PA-C  benzonatate (TESSALON) 100 MG capsule Take 1 capsule (100 mg total) by  mouth every 8 (eight) hours. 11/30/19   Henderly, Britni A, PA-C  ciprofloxacin (CILOXAN) 0.3 % ophthalmic ointment Place half in ribbon to bilateral eyes twice daily. 02/19/20   Henderly, Britni A, PA-C  clindamycin (CLEOCIN) 150 MG capsule Take 2 capsules (300 mg total) by mouth 4 (four) times daily. For 7 days 12/04/16   Pauline Aus, PA-C  diclofenac (VOLTAREN) 75 MG EC tablet Take 1 tablet (75 mg total) by mouth 2 (two) times daily. Take with food 12/04/16   Triplett, Tammy, PA-C  fluticasone (FLONASE) 50 MCG/ACT nasal spray Place 2 sprays into both nostrils daily. 11/30/19   Henderly, Britni A, PA-C  lidocaine (LIDODERM) 5 % Place 1 patch onto the skin daily. Remove & Discard patch within 12 hours or as directed by MD 10/26/19   Terald Sleeper, MD  ondansetron (ZOFRAN ODT) 4 MG disintegrating tablet Take 1 tablet (4 mg total) by mouth every 8 (eight) hours as needed for nausea or vomiting. 12/28/19   Burgess Amor, PA-C  oseltamivir (TAMIFLU) 75 MG capsule Take 1 capsule (75 mg total) by mouth 2 (two) times daily. 12/28/18   Fisher, Roselyn Bering, PA-C  traMADol (ULTRAM) 50 MG tablet Take 1 tablet (50 mg total) by mouth every 6 (six) hours as needed. 10/01/15   Evon Slack, PA-C  Allergies    Patient has no known allergies.  Review of Systems   Review of Systems  Constitutional: Negative for chills and fever.  HENT: Positive for dental problem, rhinorrhea and sneezing. Negative for sore throat.   Respiratory: Positive for cough. Negative for shortness of breath.   Cardiovascular: Negative for chest pain.    Physical Exam Updated Vital Signs BP (!) 150/75 (BP Location: Right Arm)   Pulse 80   Temp 98.3 F (36.8 C) (Oral)   Resp 16   Ht 5\' 5"  (1.651 m)   Wt 52.6 kg   SpO2 97%   BMI 19.30 kg/m   Physical Exam Vitals and nursing note reviewed.  Constitutional:      Appearance: He is not ill-appearing.  HENT:     Head: Normocephalic and atraumatic.     Right Ear: Tympanic  membrane normal.     Left Ear: Tympanic membrane normal.     Nose: Congestion present.     Mouth/Throat:     Mouth: Mucous membranes are moist.     Pharynx: No oropharyngeal exudate or posterior oropharyngeal erythema.     Comments: + post nasal drip Eyes:     Conjunctiva/sclera: Conjunctivae normal.  Cardiovascular:     Rate and Rhythm: Normal rate and regular rhythm.  Pulmonary:     Effort: Pulmonary effort is normal.     Breath sounds: Normal breath sounds. No wheezing, rhonchi or rales.  Skin:    General: Skin is warm and dry.     Coloration: Skin is not jaundiced.  Neurological:     Mental Status: He is alert.     ED Results / Procedures / Treatments   Labs (all labs ordered are listed, but only abnormal results are displayed) Labs Reviewed - No data to display  EKG None  Radiology No results found.  Procedures Procedures (including critical care time)  Medications Ordered in ED Medications - No data to display  ED Course  I have reviewed the triage vital signs and the nursing notes.  Pertinent labs & imaging results that were available during my care of the patient were reviewed by me and considered in my medical decision making (see chart for details).    MDM Rules/Calculators/A&P                      34 year old male who presents to the ED today complaining of sneezing, congestion, postnasal drip, cough for the past several days.  History of seasonal allergies however reports it is worse today.  He states that he missed work due to his symptoms.  He is requesting a work note.  He is also requesting another day off.  On arrival to the ED patient is afebrile, nontachycardic nontachypneic.  He appears to be in no acute distress.  He does have nasal congestion as well as postnasal drip on exam.  TMs clear.  No erythema or edema to throat.  His lungs are clear to auscultation bilaterally.  He is not actively coughing in the room.  Suspect that his cough is related  to his postnasal drip.  Have advised over-the-counter medications including Flonase and Zyrtec for symptomatic relief.  We will give him an additional day to rest.  Patient advised to follow-up with his PCP. Strict return precautions have been discussed with pt including worsening symptoms, fever, sore throat, inability to swallow, SOB. Pt is in agreement with plan and stable for discharge home.   This note was prepared  using Systems analyst and may include unintentional dictation errors due to the inherent limitations of voice recognition software.  Final Clinical Impression(s) / ED Diagnoses Final diagnoses:  Seasonal allergies    Rx / DC Orders ED Discharge Orders    None       Discharge Instructions     Please pick up Flonase and Zyrtec over the counter and take as prescribed  Follow up with your PCP for further evaluation of your allergies Return to the ED if you start experiencing fevers, sore throat, inability to swallow, shortness of breath.        Eustaquio Maize, PA-C 04/15/20 1129    Fredia Sorrow, MD 04/20/20 [phone]

## 2020-04-15 NOTE — Discharge Instructions (Signed)
Please pick up Flonase and Zyrtec over the counter and take as prescribed  Follow up with your PCP for further evaluation of your allergies Return to the ED if you start experiencing fevers, sore throat, inability to swallow, shortness of breath.

## 2020-08-06 ENCOUNTER — Other Ambulatory Visit: Payer: Self-pay

## 2020-08-06 ENCOUNTER — Emergency Department (HOSPITAL_COMMUNITY): Payer: HRSA Program

## 2020-08-06 ENCOUNTER — Emergency Department (HOSPITAL_COMMUNITY)
Admission: EM | Admit: 2020-08-06 | Discharge: 2020-08-06 | Disposition: A | Discharge status: Home / Self Care | Payer: HRSA Program | Attending: Emergency Medicine | Admitting: Emergency Medicine

## 2020-08-06 DIAGNOSIS — Z79899 Other long term (current) drug therapy: Secondary | ICD-10-CM | POA: Insufficient documentation

## 2020-08-06 DIAGNOSIS — F17290 Nicotine dependence, other tobacco product, uncomplicated: Secondary | ICD-10-CM | POA: Diagnosis not present

## 2020-08-06 DIAGNOSIS — F909 Attention-deficit hyperactivity disorder, unspecified type: Secondary | ICD-10-CM | POA: Diagnosis not present

## 2020-08-06 DIAGNOSIS — U071 COVID-19: Secondary | ICD-10-CM | POA: Diagnosis not present

## 2020-08-06 DIAGNOSIS — R05 Cough: Secondary | ICD-10-CM | POA: Diagnosis present

## 2020-08-06 LAB — SARS CORONAVIRUS 2 BY RT PCR (HOSPITAL ORDER, PERFORMED IN ~~LOC~~ HOSPITAL LAB): SARS Coronavirus 2: POSITIVE — AB

## 2020-08-06 MED ORDER — ALBUTEROL SULFATE HFA 108 (90 BASE) MCG/ACT IN AERS: 1.0000 | INHALATION_SPRAY | Freq: Four times a day (QID) | RESPIRATORY_TRACT | 0 refills | Status: DC | PRN

## 2020-08-06 NOTE — Discharge Instructions (Signed)
As discussed, you will need to maintain home isolation for the next 8 days (10 days from onset of symptoms).  However, if you are running a fever beyond this time, you will need to extend this (should be fever free for 3 days before returning to work).  Get rechecked for any increased shortness of breath, weakness or worsening symptoms.  Make sure you are drinking plenty of fluids to maintain hydration.  Continue taking Tylenol or Motrin for body aches and headache.  Use the inhaler prescribed if you develop any wheezing which some people do about a week into their illness.     Person Under Monitoring Name: Jason Morton  Location: [address] [place] [address]   Infection Prevention Recommendations for Individuals Confirmed to have, or Being Evaluated for, 2019 Novel Coronavirus (COVID-19) Infection Who Receive Care at Home  Individuals who are confirmed to have, or are being evaluated for, COVID-19 should follow the prevention steps below until a healthcare provider or local or state health department says they can return to normal activities.  Stay home except to get medical care You should restrict activities outside your home, except for getting medical care. Do not go to work, school, or public areas, and do not use public transportation or taxis.  Call ahead before visiting your doctor Before your medical appointment, call the healthcare provider and tell them that you have, or are being evaluated for, COVID-19 infection. This will help the healthcare provider's office take steps to keep other people from getting infected. Ask your healthcare provider to call the local or state health department.  Monitor your symptoms Seek prompt medical attention if your illness is worsening (e.g., difficulty breathing). Before going to your medical appointment, call the healthcare provider and tell them that you have, or are being evaluated for, COVID-19 infection. Ask your  healthcare provider to call the local or state health department.  Wear a facemask You should wear a facemask that covers your nose and mouth when you are in the same room with other people and when you visit a healthcare provider. People who live with or visit you should also wear a facemask while they are in the same room with you.  Separate yourself from other people in your home As much as possible, you should stay in a different room from other people in your home. Also, you should use a separate bathroom, if available.  Avoid sharing household items You should not share dishes, drinking glasses, cups, eating utensils, towels, bedding, or other items with other people in your home. After using these items, you should wash them thoroughly with soap and water.  Cover your coughs and sneezes Cover your mouth and nose with a tissue when you cough or sneeze, or you can cough or sneeze into your sleeve. Throw used tissues in a lined trash can, and immediately wash your hands with soap and water for at least 20 seconds or use an alcohol-based hand rub.  Wash your [employer] your hands often and thoroughly with soap and water for at least 20 seconds. You can use an alcohol-based hand sanitizer if soap and water are not available and if your hands are not visibly dirty. Avoid touching your eyes, nose, and mouth with unwashed hands.   Prevention Steps for Caregivers and Household Members of Individuals Confirmed to have, or Being Evaluated for, COVID-19 Infection Being Cared for in the Home  If you live with, or provide care at home for, a  person confirmed to have, or being evaluated for, COVID-19 infection please follow these guidelines to prevent infection:  Follow healthcare provider's instructions Make sure that you understand and can help the patient follow any healthcare provider instructions for all care.  Provide for the patient's basic needs You should help the patient with  basic needs in the home and provide support for getting groceries, prescriptions, and other personal needs.  Monitor the patient's symptoms If they are getting sicker, call his or her medical provider and tell them that the patient has, or is being evaluated for, COVID-19 infection. This will help the healthcare provider's office take steps to keep other people from getting infected. Ask the healthcare provider to call the local or state health department.  Limit the number of people who have contact with the patient If possible, have only one caregiver for the patient. Other household members should stay in another home or place of residence. If this is not possible, they should stay in another room, or be separated from the patient as much as possible. Use a separate bathroom, if available. Restrict visitors who do not have an essential need to be in the home.  Keep older adults, very young children, and other sick people away from the patient Keep older adults, very young children, and those who have compromised immune systems or chronic health conditions away from the patient. This includes people with chronic heart, lung, or kidney conditions, diabetes, and cancer.  Ensure good ventilation Make sure that shared spaces in the home have good air flow, such as from an air conditioner or an opened window, weather permitting.  Wash your hands often Wash your hands often and thoroughly with soap and water for at least 20 seconds. You can use an alcohol based hand sanitizer if soap and water are not available and if your hands are not visibly dirty. Avoid touching your eyes, nose, and mouth with unwashed hands. Use disposable paper towels to dry your hands. If not available, use dedicated cloth towels and replace them when they become wet.  Wear a facemask and gloves Wear a disposable facemask at all times in the room and gloves when you touch or have contact with the patient's blood, body  fluids, and/or secretions or excretions, such as sweat, saliva, sputum, nasal mucus, vomit, urine, or feces.  Ensure the mask fits over your nose and mouth tightly, and do not touch it during use. Throw out disposable facemasks and gloves after using them. Do not reuse. Wash your hands immediately after removing your facemask and gloves. If your personal clothing becomes contaminated, carefully remove clothing and launder. Wash your hands after handling contaminated clothing. Place all used disposable facemasks, gloves, and other waste in a lined container before disposing them with other household waste. Remove gloves and wash your hands immediately after handling these items.  Do not share dishes, glasses, or other household items with the patient Avoid sharing household items. You should not share dishes, drinking glasses, cups, eating utensils, towels, bedding, or other items with a patient who is confirmed to have, or being evaluated for, COVID-19 infection. After the person uses these items, you should wash them thoroughly with soap and water.  Wash laundry thoroughly Immediately remove and wash clothes or bedding that have blood, body fluids, and/or secretions or excretions, such as sweat, saliva, sputum, nasal mucus, vomit, urine, or feces, on them. Wear gloves when handling laundry from the patient. Read and follow directions on labels of laundry or  clothing items and detergent. In general, wash and dry with the warmest temperatures recommended on the label.  Clean all areas the individual has used often Clean all touchable surfaces, such as counters, tabletops, doorknobs, bathroom fixtures, toilets, phones, keyboards, tablets, and bedside tables, every day. Also, clean any surfaces that may have blood, body fluids, and/or secretions or excretions on them. Wear gloves when cleaning surfaces the patient has come in contact with. Use a diluted bleach solution (e.g., dilute bleach with 1  part bleach and 10 parts water) or a household disinfectant with a label that says EPA-registered for coronaviruses. To make a bleach solution at home, add 1 tablespoon of bleach to 1 quart (4 cups) of water. For a larger supply, add  cup of bleach to 1 gallon (16 cups) of water. Read labels of cleaning products and follow recommendations provided on product labels. Labels contain instructions for safe and effective use of the cleaning product including precautions you should take when applying the product, such as wearing gloves or eye protection and making sure you have good ventilation during use of the product. Remove gloves and wash hands immediately after cleaning.  Monitor yourself for signs and symptoms of illness Caregivers and household members are considered close contacts, should monitor their health, and will be asked to limit movement outside of the home to the extent possible. Follow the monitoring steps for close contacts listed on the symptom monitoring form.   ? If you have additional questions, contact your local health department or call the epidemiologist on call at [phone] (available 24/7). ? This guidance is subject to change. For the most up-to-date guidance from [facility], please refer to their website: [email or web address]

## 2020-08-06 NOTE — ED Provider Notes (Signed)
[facility] EMERGENCY DEPARTMENT Provider Note   CSN: [id number] Arrival date & time: 08/06/20  0753     History Chief Complaint  Patient presents with  . Cough  . Generalized Body Aches    Jason Morton is a 34 y.o. male presenting for evaluation of possible Covid 81.  He was exposed to a coworker last week, the department was quarantined and pt felt well until he developed body aches, nasal congestion with rhinorrhea, nonproductive cough. Denies fever, tmax at home less than 100, 99.6 here.  He denies sob, chest pain, no n/v/d.  He is taking tylenol 1 gram q6 hours for body aches.                                                                                                                                          The history is provided by the patient.       Past Medical History:  Diagnosis Date  . ADHD   . Anxiety   . Depression     There are no problems to display for this patient.   Past Surgical History:  Procedure Laterality Date  . arm surgery Left        No family history on file.  Social History   Tobacco Use  . Smoking status: Current Every Day Smoker    Types: E-cigarettes  . Smokeless tobacco: Current User    Types: Chew  Vaping Use  . Vaping Use: Every day  . Substances: Nicotine  Substance Use Topics  . Alcohol use: Yes  . Drug use: No    Home Medications Prior to Admission medications   Medication Sig Start Date End Date Taking? Authorizing Provider  acetaminophen (TYLENOL) 325 MG tablet Take 2 tablets (650 mg total) by mouth every 6 (six) hours as needed for up to 30 doses for mild pain or moderate pain. 10/26/19   Terald Sleeper, MD  albuterol (VENTOLIN HFA) 108 (90 Base) MCG/ACT inhaler Inhale 1-2 puffs into the lungs every 6 (six) hours as needed for wheezing or shortness of breath. 08/06/20   Burgess Amor, PA-C  benzonatate (TESSALON) 100 MG capsule Take 1 capsule (100 mg total) by mouth every 8 (eight) hours. 11/30/19   Henderly,  Britni A, PA-C  ciprofloxacin (CILOXAN) 0.3 % ophthalmic ointment Place half in ribbon to bilateral eyes twice daily. 02/19/20   Henderly, Britni A, PA-C  clindamycin (CLEOCIN) 150 MG capsule Take 2 capsules (300 mg total) by mouth 4 (four) times daily. For 7 days 12/04/16   Pauline Aus, PA-C  diclofenac (VOLTAREN) 75 MG EC tablet Take 1 tablet (75 mg total) by mouth 2 (two) times daily. Take with food 12/04/16   Triplett, Tammy, PA-C  fluticasone (FLONASE) 50 MCG/ACT nasal spray Place 2 sprays into both nostrils daily. 11/30/19   Henderly, Britni A, PA-C  lidocaine (LIDODERM) 5 % Place 1 patch  onto the skin daily. Remove & Discard patch within 12 hours or as directed by MD 10/26/19   Terald Sleeper, MD  ondansetron (ZOFRAN ODT) 4 MG disintegrating tablet Take 1 tablet (4 mg total) by mouth every 8 (eight) hours as needed for nausea or vomiting. 12/28/19   Burgess Amor, PA-C  oseltamivir (TAMIFLU) 75 MG capsule Take 1 capsule (75 mg total) by mouth 2 (two) times daily. 12/28/18   Fisher, Roselyn Bering, PA-C  traMADol (ULTRAM) 50 MG tablet Take 1 tablet (50 mg total) by mouth every 6 (six) hours as needed. 10/01/15   Evon Slack, PA-C    Allergies    Patient has no known allergies.  Review of Systems   Review of Systems  Constitutional: Negative for chills and fever.  HENT: Positive for congestion, rhinorrhea and sinus pressure. Negative for sore throat.   Eyes: Negative.   Respiratory: Positive for cough. Negative for chest tightness, shortness of breath and wheezing.   Cardiovascular: Negative for chest pain.  Gastrointestinal: Negative for diarrhea, nausea and vomiting.  Genitourinary: Negative.   Musculoskeletal: Positive for myalgias. Negative for arthralgias, joint swelling and neck pain.  Skin: Negative.  Negative for rash and wound.  Neurological: Positive for headaches. Negative for dizziness, weakness, light-headedness and numbness.  Psychiatric/Behavioral: Negative.   All other  systems reviewed and are negative.   Physical Exam Updated Vital Signs BP 113/76 (BP Location: Right Arm)   Pulse 91   Temp 99.6 F (37.6 C) (Oral)   Resp 16   Ht 5\' 4"  (1.626 m)   Wt 49.9 kg   SpO2 96%   BMI 18.88 kg/m   Physical Exam Vitals and nursing note reviewed.  Constitutional:      Appearance: He is well-developed.  HENT:     Head: Normocephalic and atraumatic.     Nose: Congestion present.  Eyes:     Conjunctiva/sclera: Conjunctivae normal.  Cardiovascular:     Rate and Rhythm: Normal rate and regular rhythm.     Heart sounds: Normal heart sounds.  Pulmonary:     Effort: Pulmonary effort is normal. No respiratory distress.     Breath sounds: Normal breath sounds. No wheezing, rhonchi or rales.  Abdominal:     General: Bowel sounds are normal.     Palpations: Abdomen is soft.     Tenderness: There is no abdominal tenderness.  Musculoskeletal:        General: Normal range of motion.     Cervical back: Normal range of motion.  Skin:    General: Skin is warm and dry.  Neurological:     Mental Status: He is alert.     ED Results / Procedures / Treatments   Labs (all labs ordered are listed, but only abnormal results are displayed) Labs Reviewed  SARS CORONAVIRUS 2 BY RT PCR (HOSPITAL ORDER, PERFORMED IN [facility]) - Abnormal; Notable for the following components:      Result Value   SARS Coronavirus 2 POSITIVE (*)    All other components within normal limits    EKG None  Radiology No results found.  Procedures Procedures (including critical care time)  Medications Ordered in ED Medications - No data to display  ED Course  I have reviewed the triage vital signs and the nursing notes.  Pertinent labs & imaging results that were available during my care of the patient were reviewed by me and considered in my medical decision making (see chart for details).  MDM Rules/Calculators/A&P                          Patient is  positive for COVID-19, he appears to have mild disease at this time.  He has no shortness of breath and his oxygen saturation is normal range.  He was given albuterol MDI prescription in the event he develops wheezing he was advised to start using this as needed.  Continue Tylenol, rest increase fluid intake.  Strict return precautions were outlined for this patient and he was advised about need for home quarantine for a total of 10 days.  Cindi Carbon was evaluated in Emergency Department on 08/06/2020 for the symptoms described in the history of present illness. He was evaluated in the context of the global COVID-19 pandemic, which necessitated consideration that the patient might be at risk for infection with the SARS-CoV-2 virus that causes COVID-19. Institutional protocols and algorithms that pertain to the evaluation of patients at risk for COVID-19 are in a state of rapid change based on information released by regulatory bodies including the CDC and federal and state organizations. These policies and algorithms were followed during the patient's care in the ED.  Final Clinical Impression(s) / ED Diagnoses Final diagnoses:  COVID-19    Rx / DC Orders ED Discharge Orders         Ordered    albuterol (VENTOLIN HFA) 108 (90 Base) MCG/ACT inhaler  Every 6 hours PRN     Discontinue  Reprint     08/06/20 1026           Burgess Amor, PA-C 08/06/20 1041    Bethann Berkshire, MD 08/07/20 [phone]

## 2020-12-18 ENCOUNTER — Other Ambulatory Visit: Payer: Self-pay

## 2020-12-18 ENCOUNTER — Ambulatory Visit
Admission: EM | Admit: 2020-12-18 | Discharge: 2020-12-18 | Disposition: A | Discharge status: Home / Self Care | Payer: Self-pay | Attending: Family Medicine | Admitting: Family Medicine

## 2020-12-18 DIAGNOSIS — Z1152 Encounter for screening for COVID-19: Secondary | ICD-10-CM

## 2020-12-18 NOTE — ED Triage Notes (Signed)
Needs covid test

## 2020-12-19 LAB — NOVEL CORONAVIRUS, NAA: SARS-CoV-2, NAA: NOT DETECTED

## 2020-12-19 LAB — SARS-COV-2, NAA 2 DAY TAT

## 2021-02-19 ENCOUNTER — Encounter (HOSPITAL_COMMUNITY): Payer: Self-pay

## 2021-02-19 ENCOUNTER — Emergency Department (HOSPITAL_COMMUNITY): Payer: Self-pay

## 2021-02-19 ENCOUNTER — Emergency Department (HOSPITAL_COMMUNITY)
Admission: EM | Admit: 2021-02-19 | Discharge: 2021-02-19 | Disposition: A | Discharge status: Home / Self Care | Payer: Self-pay | Attending: Emergency Medicine | Admitting: Emergency Medicine

## 2021-02-19 ENCOUNTER — Other Ambulatory Visit: Payer: Self-pay

## 2021-02-19 DIAGNOSIS — X58XXXA Exposure to other specified factors, initial encounter: Secondary | ICD-10-CM | POA: Insufficient documentation

## 2021-02-19 DIAGNOSIS — Y990 Civilian activity done for income or pay: Secondary | ICD-10-CM | POA: Insufficient documentation

## 2021-02-19 DIAGNOSIS — S56519A Strain of other extensor muscle, fascia and tendon at forearm level, unspecified arm, initial encounter: Secondary | ICD-10-CM

## 2021-02-19 DIAGNOSIS — F17290 Nicotine dependence, other tobacco product, uncomplicated: Secondary | ICD-10-CM | POA: Insufficient documentation

## 2021-02-19 DIAGNOSIS — S56511A Strain of other extensor muscle, fascia and tendon at forearm level, right arm, initial encounter: Secondary | ICD-10-CM | POA: Insufficient documentation

## 2021-02-19 MED ORDER — DICLOFENAC SODIUM 75 MG PO TBEC: 75.0000 mg | DELAYED_RELEASE_TABLET | Freq: Two times a day (BID) | ORAL | 0 refills | Status: DC

## 2021-02-19 NOTE — ED Triage Notes (Signed)
Presents for concerns over left elbow pain that began at work yesterday No known injury Reports it just began hurting

## 2021-02-19 NOTE — Discharge Instructions (Addendum)
Your xray is negative for acute injury, as discussed I suspect you have strained the muscle(s) and the tendons of your forearm and elbow.  Gentle compression, ice packs as much as is comfortable and anti inflammatories should resolve your pain.  See your primary MD if your symptoms do not improve with this treatment plan.

## 2021-02-21 NOTE — ED Provider Notes (Signed)
[facility] EMERGENCY DEPARTMENT Provider Note   CSN: [id number] Arrival date & time: 02/19/21  5465     History Chief Complaint  Patient presents with  . Arm Injury    left    Jason Morton is a 35 y.o. male , left handed, with a history of distant left forearm fracture presenting with left elbow pain which started while at work yesterday.  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  Pain is aching and worse with movement of the wrist and elbow.  He denies numbness or weakness in the forearm or hand. He has found no alleviators for his symptoms.     HPI     Past Medical History:  Diagnosis Date  . ADHD   . Anxiety   . Depression     There are no problems to display for this patient.   Past Surgical History:  Procedure Laterality Date  . arm surgery Left        History reviewed. No pertinent family history.  Social History   Tobacco Use  . Smoking status: Current Every Day Smoker    Types: E-cigarettes  . Smokeless tobacco: Current User    Types: Chew  Vaping Use  . Vaping Use: Every day  . Substances: Nicotine  Substance Use Topics  . Alcohol use: Yes  . Drug use: No    Home Medications Prior to Admission medications   Medication Sig Start Date End Date Taking? Authorizing Provider  clonazePAM (KLONOPIN) 0.5 MG tablet Take 0.5 mg by mouth daily.   Yes [provider]  diclofenac (VOLTAREN) 75 MG EC tablet Take 1 tablet (75 mg total) by mouth 2 (two) times daily. 02/19/21  Yes Jannatul Wojdyla, Raynelle Fanning, PA-C  Ferrous Sulfate (IRON PO) Take 1 tablet by mouth daily.   Yes [provider]  PARoxetine (PAXIL) 10 MG tablet Take 10 mg by mouth daily.   Yes [provider]  acetaminophen (TYLENOL) 325 MG tablet Take 2 tablets (650 mg total) by mouth every 6 (six) hours as needed for up to 30 doses for mild pain or moderate pain. Patient not  taking: Reported on 02/19/2021 10/26/19   Terald Sleeper, MD  albuterol (VENTOLIN HFA) 108 (90 Base) MCG/ACT inhaler Inhale 1-2 puffs into the lungs every 6 (six) hours as needed for wheezing or shortness of breath. Patient not taking: Reported on 02/19/2021 08/06/20   Burgess Amor, PA-C  benzonatate (TESSALON) 100 MG capsule Take 1 capsule (100 mg total) by mouth every 8 (eight) hours. Patient not taking: Reported on 02/19/2021 11/30/19   Henderly, Britni A, PA-C  ciprofloxacin (CILOXAN) 0.3 % ophthalmic ointment Place half in ribbon to bilateral eyes twice daily. Patient not taking: Reported on 02/19/2021 02/19/20   Henderly, Britni A, PA-C  clindamycin (CLEOCIN) 150 MG capsule Take 2 capsules (300 mg total) by mouth 4 (four) times daily. For 7 days Patient not taking: Reported on 02/19/2021 12/04/16   Triplett, Tammy, PA-C  fluticasone (FLONASE) 50 MCG/ACT nasal spray Place 2 sprays into both nostrils daily. Patient not taking: Reported on 02/19/2021 11/30/19   Henderly, Britni A, PA-C  lidocaine (LIDODERM) 5 % Place 1 patch onto the skin daily. Remove & Discard patch within 12 hours or as directed by MD Patient not taking: Reported on 02/19/2021 10/26/19   Terald Sleeper, MD  ondansetron (ZOFRAN ODT) 4 MG disintegrating  tablet Take 1 tablet (4 mg total) by mouth every 8 (eight) hours as needed for nausea or vomiting. Patient not taking: Reported on 02/19/2021 12/28/19   Burgess Amor, PA-C  oseltamivir (TAMIFLU) 75 MG capsule Take 1 capsule (75 mg total) by mouth 2 (two) times daily. Patient not taking: No sig reported 12/28/18   Faythe Ghee, PA-C  traMADol (ULTRAM) 50 MG tablet Take 1 tablet (50 mg total) by mouth every 6 (six) hours as needed. Patient not taking: Reported on 02/19/2021 10/01/15   Evon Slack, PA-C    Allergies    Patient has no known allergies.  Review of Systems   Review of Systems  Constitutional: Negative for fever.  Musculoskeletal: Positive for arthralgias and joint swelling.  Negative for myalgias.  Skin: Negative.   Neurological: Negative for weakness and numbness.    Physical Exam Updated Vital Signs BP 125/81 (BP Location: Right Arm)   Pulse 60   Temp 98.1 F (36.7 C) (Oral)   Resp 16   Ht 5\' 4"  (1.626 m)   Wt 51.7 kg   SpO2 100%   BMI 19.57 kg/m   Physical Exam Constitutional:      Appearance: He is well-developed and well-nourished.  HENT:     Head: Atraumatic.  Cardiovascular:     Comments: Pulses equal bilaterally Musculoskeletal:        General: Tenderness present.     Left elbow: No swelling, deformity or effusion. Tenderness present. No olecranon process tenderness.     Cervical back: Normal range of motion.     Comments: ttp along dorsal proximal forearm, worsening with elbow extension and forearm supination.  No edema, no crepitus or effusion.  Nontender over olecranon bursa.  Skin:    General: Skin is warm and dry.  Neurological:     Mental Status: He is alert.     Sensory: No sensory deficit.     Deep Tendon Reflexes: Strength normal. Reflexes normal.  Psychiatric:        Mood and Affect: Mood and affect normal.     ED Results / Procedures / Treatments   Labs (all labs ordered are listed, but only abnormal results are displayed) Labs Reviewed - No data to display  EKG None  Radiology Results for orders placed or performed during the hospital encounter of 12/18/20  Novel Coronavirus, NAA ([facility])   Specimen: Nasopharyngeal(NP) swabs in vial transport medium   Nasopharynge  Patient  Result Value Ref Range   SARS-CoV-2, NAA Not Detected Not Detected  SARS-COV-2, NAA 2 DAY TAT   Nasopharynge  Patient  Result Value Ref Range   SARS-CoV-2, NAA 2 DAY TAT Performed    DG Elbow Complete Left  Result Date: 02/19/2021 CLINICAL DATA:  Left elbow pain EXAM: LEFT ELBOW - COMPLETE 3+ VIEW COMPARISON:  None. FINDINGS: There is no evidence of fracture, dislocation, or joint effusion. There is no evidence of arthropathy or other  focal bone abnormality. Soft tissues are unremarkable. IMPRESSION: Negative. Electronically Signed   By: 04/21/2021 M.D.   On: 02/19/2021 10:58     Procedures Procedures   Medications Ordered in ED Medications - No data to display  ED Course  I have reviewed the triage vital signs and the nursing notes.  Pertinent labs & imaging results that were available during my care of the patient were reviewed by me and considered in my medical decision making (see chart for details).    MDM Rules/Calculators/A&P  Imaging reviewed and negative.  Discussed results with pt. Exam suggesting extensor forearm strain/overuse. Discussed RICE tx, nsaids, ace provided for compression.  F/u pcp if not improving with tx plan.    Final Clinical Impression(s) / ED Diagnoses Final diagnoses:  Strain of extensor muscle at forearm level    Rx / DC Orders ED Discharge Orders         Ordered    diclofenac (VOLTAREN) 75 MG EC tablet  2 times daily        02/19/21 1134           Victoriano Lain 02/21/21 1954    Terrilee Files, MD 02/22/21 [phone]

## 2021-04-22 ENCOUNTER — Emergency Department (HOSPITAL_COMMUNITY)
Admission: EM | Admit: 2021-04-22 | Discharge: 2021-04-22 | Disposition: A | Discharge status: Home / Self Care | Payer: Self-pay | Attending: Emergency Medicine | Admitting: Emergency Medicine

## 2021-04-22 ENCOUNTER — Other Ambulatory Visit: Payer: Self-pay

## 2021-04-22 ENCOUNTER — Encounter (HOSPITAL_COMMUNITY): Payer: Self-pay | Admitting: Emergency Medicine

## 2021-04-22 DIAGNOSIS — F17210 Nicotine dependence, cigarettes, uncomplicated: Secondary | ICD-10-CM | POA: Insufficient documentation

## 2021-04-22 DIAGNOSIS — W57XXXA Bitten or stung by nonvenomous insect and other nonvenomous arthropods, initial encounter: Secondary | ICD-10-CM | POA: Insufficient documentation

## 2021-04-22 DIAGNOSIS — R21 Rash and other nonspecific skin eruption: Secondary | ICD-10-CM | POA: Insufficient documentation

## 2021-04-22 DIAGNOSIS — S50861A Insect bite (nonvenomous) of right forearm, initial encounter: Secondary | ICD-10-CM | POA: Insufficient documentation

## 2021-04-22 MED ORDER — DIPHENHYDRAMINE HCL 25 MG PO CAPS: 25.0000 mg | ORAL_CAPSULE | ORAL | 0 refills | Status: AC | PRN

## 2021-04-22 MED ORDER — DOXYCYCLINE HYCLATE 100 MG PO CAPS: 100.0000 mg | ORAL_CAPSULE | Freq: Two times a day (BID) | ORAL | 0 refills | Status: AC

## 2021-04-22 MED ORDER — PREDNISONE 10 MG PO TABS: ORAL_TABLET | ORAL | 0 refills | Status: DC

## 2021-04-22 MED ORDER — DIPHENHYDRAMINE HCL 25 MG PO CAPS
25.0000 mg | ORAL_CAPSULE | Freq: Once | ORAL | Status: AC
  Administered 2021-04-22: 25 mg via ORAL
  Filled 2021-04-22: qty 1

## 2021-04-22 MED ORDER — PREDNISONE 50 MG PO TABS
60.0000 mg | ORAL_TABLET | Freq: Once | ORAL | Status: AC
  Administered 2021-04-22: 60 mg via ORAL
  Filled 2021-04-22: qty 1

## 2021-04-22 NOTE — ED Provider Notes (Signed)
[facility] EMERGENCY DEPARTMENT Provider Note   CSN: [id number] Arrival date & time: 04/22/21  0945     History Chief Complaint  Patient presents with  . Rash    Jason Morton is a 35 y.o. male.  HPI     Jason Morton is a 35 y.o. male who presents to the Emergency Department complaining of diffuse rash and itching.  Symptoms began 4 days ago.  He first noticed small red pruritic papules of his arms and chest that have since spread to most of his body.  States itching is worse at night and with exposure to heat.  He has been using over-the-counter calamine spray with minimal relief.  He does endorse having a tick bite to his right forearm over 1 week ago.  No fevers, chills, or joint pains.  Denies any exposure to weeds or recent yardwork.     Past Medical History:  Diagnosis Date  . ADHD   . Anemia 2021  . Anxiety   . Depression     There are no problems to display for this patient.   Past Surgical History:  Procedure Laterality Date  . arm surgery Left        History reviewed. No pertinent family history.  Social History   Tobacco Use  . Smoking status: Current Every Day Smoker    Types: E-cigarettes  . Smokeless tobacco: Current User    Types: Chew  Vaping Use  . Vaping Use: Every day  . Substances: Nicotine  Substance Use Topics  . Alcohol use: Yes  . Drug use: No    Home Medications Prior to Admission medications   Medication Sig Start Date End Date Taking? Authorizing Provider  acetaminophen (TYLENOL) 325 MG tablet Take 2 tablets (650 mg total) by mouth every 6 (six) hours as needed for up to 30 doses for mild pain or moderate pain. Patient not taking: Reported on 02/19/2021 10/26/19   Terald Sleeper, MD  albuterol (VENTOLIN HFA) 108 (90 Base) MCG/ACT inhaler Inhale 1-2 puffs into the lungs every 6 (six) hours as needed for wheezing or shortness of breath. Patient not taking: Reported on 02/19/2021 08/06/20   Burgess Amor, PA-C  benzonatate  (TESSALON) 100 MG capsule Take 1 capsule (100 mg total) by mouth every 8 (eight) hours. Patient not taking: Reported on 02/19/2021 11/30/19   Henderly, Britni A, PA-C  ciprofloxacin (CILOXAN) 0.3 % ophthalmic ointment Place half in ribbon to bilateral eyes twice daily. Patient not taking: Reported on 02/19/2021 02/19/20   Henderly, Britni A, PA-C  clindamycin (CLEOCIN) 150 MG capsule Take 2 capsules (300 mg total) by mouth 4 (four) times daily. For 7 days Patient not taking: Reported on 02/19/2021 12/04/16   Pauline Aus, PA-C  clonazePAM (KLONOPIN) 0.5 MG tablet Take 0.5 mg by mouth daily.    [provider]  diclofenac (VOLTAREN) 75 MG EC tablet Take 1 tablet (75 mg total) by mouth 2 (two) times daily. 02/19/21   Burgess Amor, PA-C  Ferrous Sulfate (IRON PO) Take 1 tablet by mouth daily.    [provider]  fluticasone (FLONASE) 50 MCG/ACT nasal spray Place 2 sprays into both nostrils daily. Patient not taking: Reported on 02/19/2021 11/30/19   Henderly, Britni A, PA-C  lidocaine (LIDODERM) 5 % Place 1 patch onto the skin daily. Remove & Discard patch within 12 hours or as directed by MD Patient not taking: Reported on 02/19/2021 10/26/19   Terald Sleeper, MD  ondansetron (ZOFRAN ODT) 4  MG disintegrating tablet Take 1 tablet (4 mg total) by mouth every 8 (eight) hours as needed for nausea or vomiting. Patient not taking: Reported on 02/19/2021 12/28/19   Burgess Amor, PA-C  oseltamivir (TAMIFLU) 75 MG capsule Take 1 capsule (75 mg total) by mouth 2 (two) times daily. Patient not taking: No sig reported 12/28/18   Faythe Ghee, PA-C  PARoxetine (PAXIL) 10 MG tablet Take 10 mg by mouth daily.    [provider]  traMADol (ULTRAM) 50 MG tablet Take 1 tablet (50 mg total) by mouth every 6 (six) hours as needed. Patient not taking: Reported on 02/19/2021 10/01/15   Evon Slack, PA-C    Allergies    Patient has no known allergies.  Review of Systems   Review of Systems   Constitutional: Negative for activity change, appetite change, chills and fever.  HENT: Negative for facial swelling, sore throat and trouble swallowing.   Respiratory: Negative for chest tightness, shortness of breath and wheezing.   Gastrointestinal: Negative for nausea and vomiting.  Musculoskeletal: Negative for arthralgias, myalgias, neck pain and neck stiffness.  Skin: Positive for rash. Negative for wound.  Neurological: Negative for dizziness, weakness, numbness and headaches.    Physical Exam Updated Vital Signs BP 122/85 (BP Location: Right Arm)   Pulse 61   Temp 98.5 F (36.9 C) (Oral)   Resp 16   Ht 5\' 4"  (1.626 m)   Wt 51.7 kg   SpO2 100%   BMI 19.57 kg/m   Physical Exam Vitals and nursing note reviewed.  Constitutional:      General: He is not in acute distress.    Appearance: Normal appearance. He is not ill-appearing.  HENT:     Mouth/Throat:     Mouth: Mucous membranes are moist.     Pharynx: Oropharynx is clear.  Eyes:     Conjunctiva/sclera: Conjunctivae normal.  Cardiovascular:     Rate and Rhythm: Normal rate and regular rhythm.     Pulses: Normal pulses.  Pulmonary:     Effort: Pulmonary effort is normal.  Abdominal:     Palpations: Abdomen is soft.     Tenderness: There is no abdominal tenderness.  Musculoskeletal:        General: No swelling or tenderness. Normal range of motion.     Cervical back: No tenderness.  Skin:    General: Skin is warm.     Capillary Refill: Capillary refill takes less than 2 seconds.     Findings: Rash present.     Comments: Diffuse papular rash to most of the body.  No edema or erythema.  No pustules or vesicles.    Neurological:     General: No focal deficit present.     Mental Status: He is alert.     Sensory: No sensory deficit.     Motor: No weakness.     ED Results / Procedures / Treatments   Labs (all labs ordered are listed, but only abnormal results are displayed) Labs Reviewed - No data to  display  EKG None  Radiology No results found.  Procedures Procedures   Medications Ordered in ED Medications - No data to display  ED Course  I have reviewed the triage vital signs and the nursing notes.  Pertinent labs & imaging results that were available during my care of the patient were reviewed by me and considered in my medical decision making (see chart for details).    MDM Rules/Calculators/A&P  Pt with a diffuse papular rash to most to the body x 4 days.  Tick bite over one week ago w/o fever,headache, arthralgias/myalgias, no bullseye lesion. No new medications.  No concerning sx's for anaphylaxis or infectious process. Will tx with benadryl and steroids.  I will also give rx for doxycyline.  Return precaution discussed.    Final Clinical Impression(s) / ED Diagnoses Final diagnoses:  Rash  Tick bite of right forearm, initial encounter    Rx / DC Orders ED Discharge Orders    None       Pauline Aus, PA-C 04/22/21 1105    Bethann Berkshire, MD 04/23/21 1017

## 2021-04-22 NOTE — Discharge Instructions (Signed)
Take the Benadryl and prednisone as directed until its finished.  I have also prescribed an antibiotic called doxycycline which you may take for the tick bite.  You may still continue to use over-the-counter calamine spray if needed for itching.  Benadryl will likely cause drowsiness.  Return to the emergency department if you develop any new or worsening symptoms.

## 2021-04-22 NOTE — ED Triage Notes (Signed)
Pt c/o rash all over body that started on Friday.

## 2021-07-21 IMAGING — DX DG ELBOW COMPLETE 3+V*L*
4 series · 4 of 4 positions shown · non-contrast
Comparison: None.

CLINICAL DATA: Left elbow pain

EXAM:
LEFT ELBOW - COMPLETE 3+ VIEW

[elbow ap]
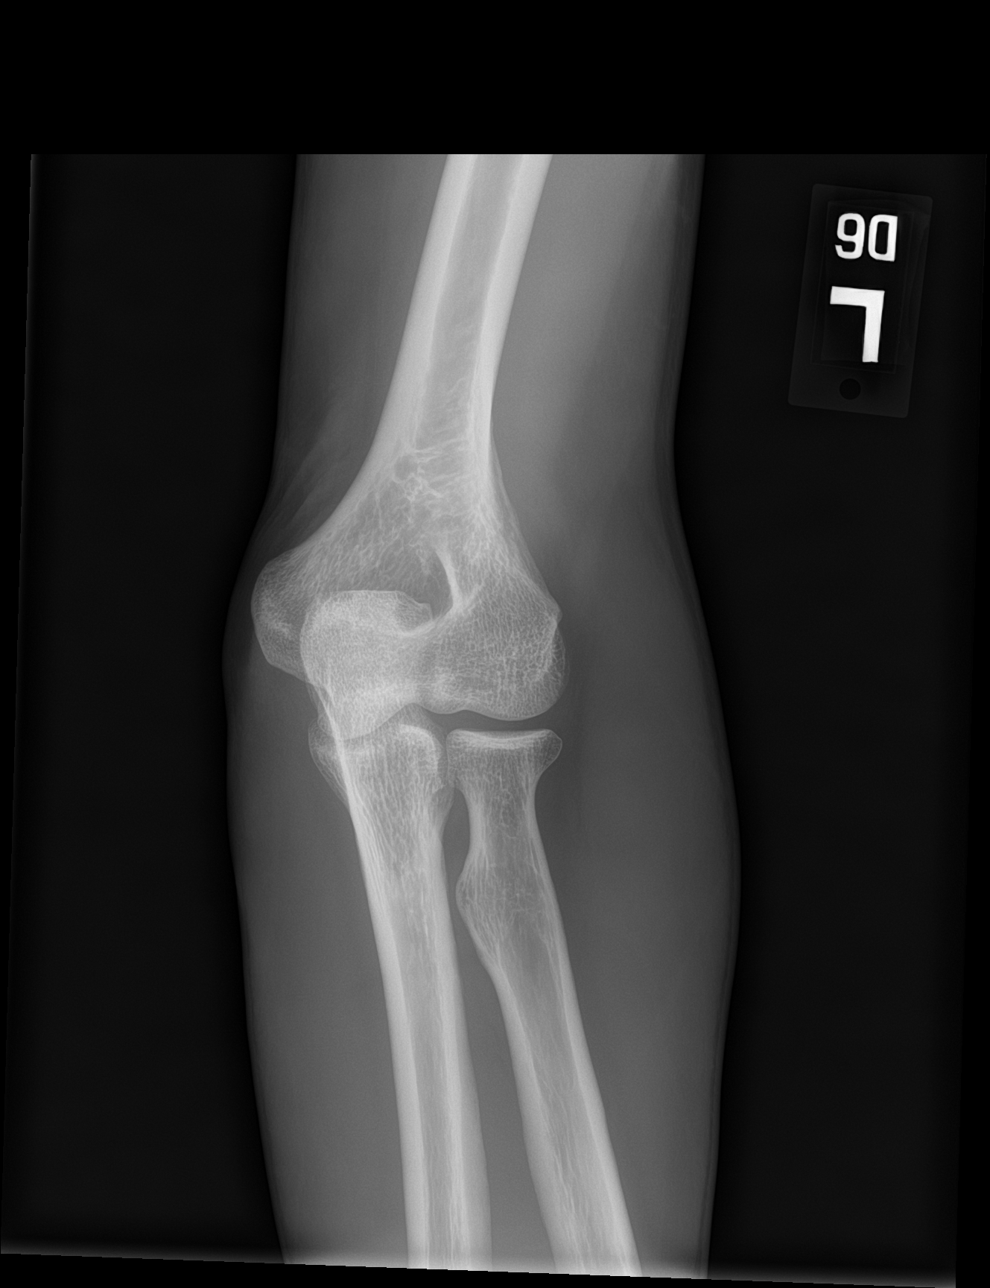

[elbow obl (1 of 2)]
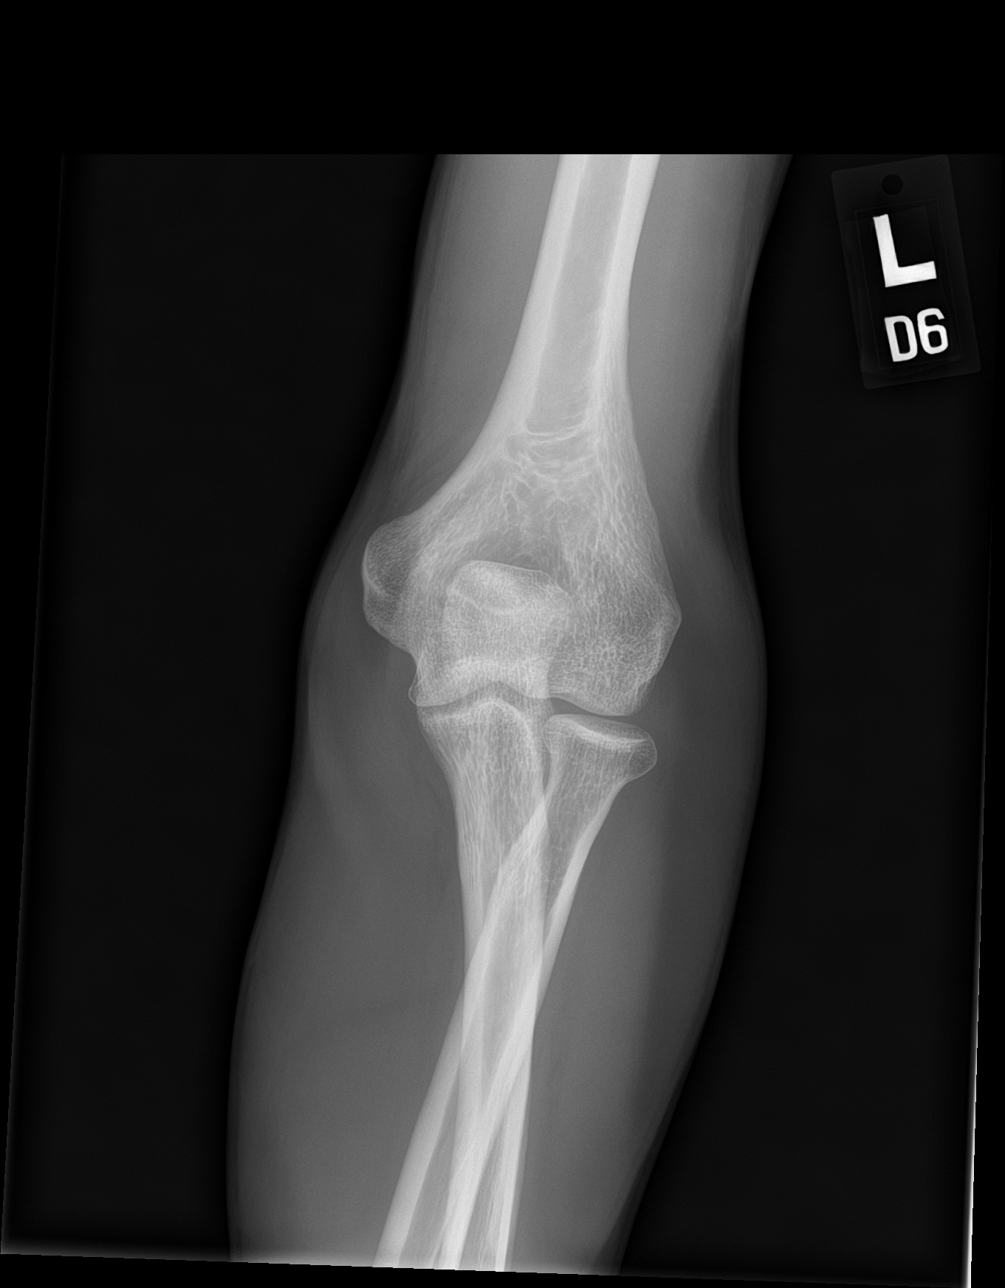

[elbow lat]
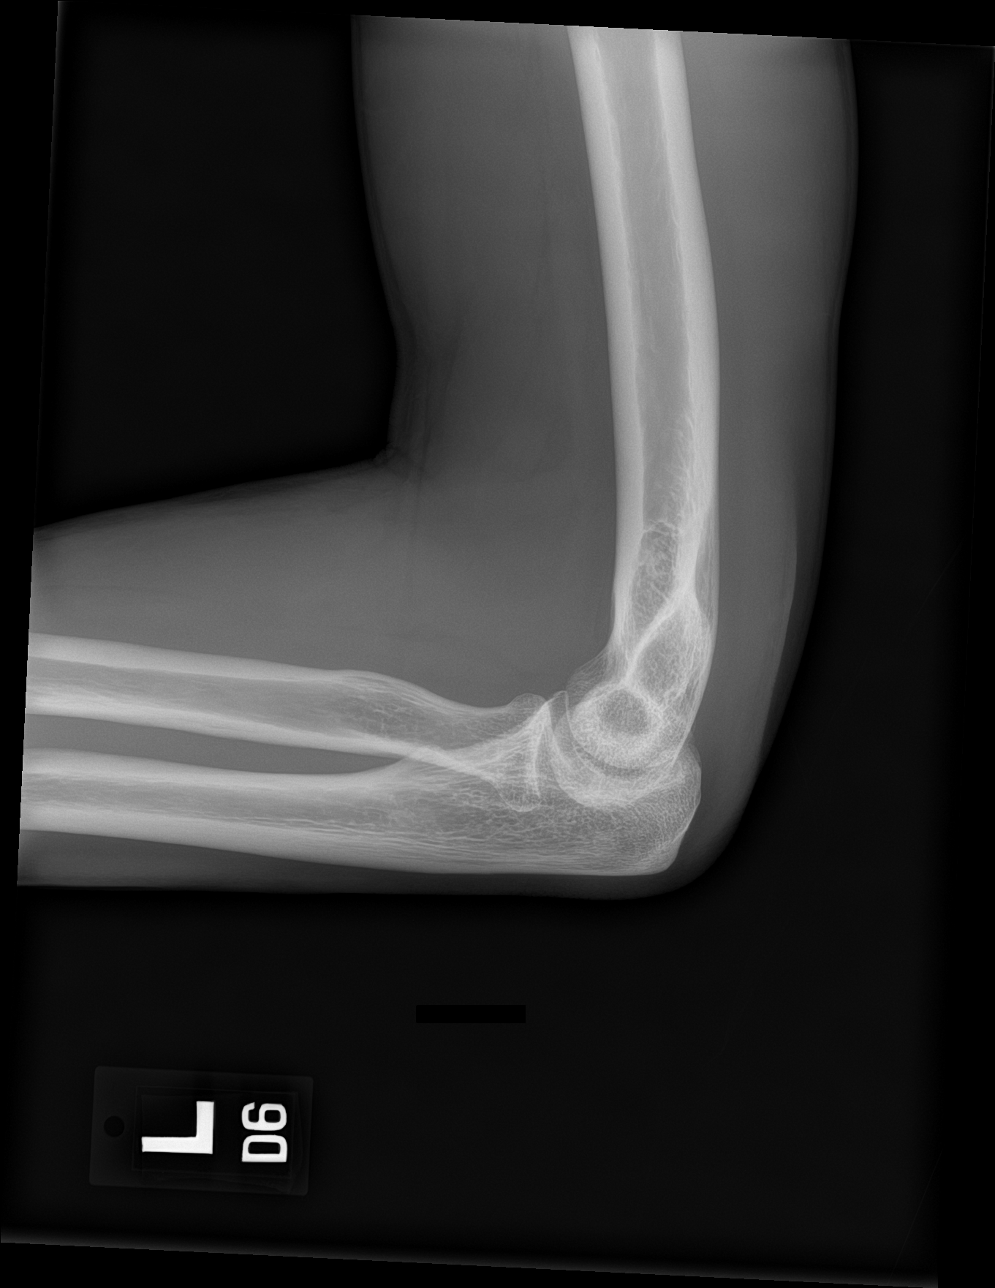

[elbow obl (2 of 2)]
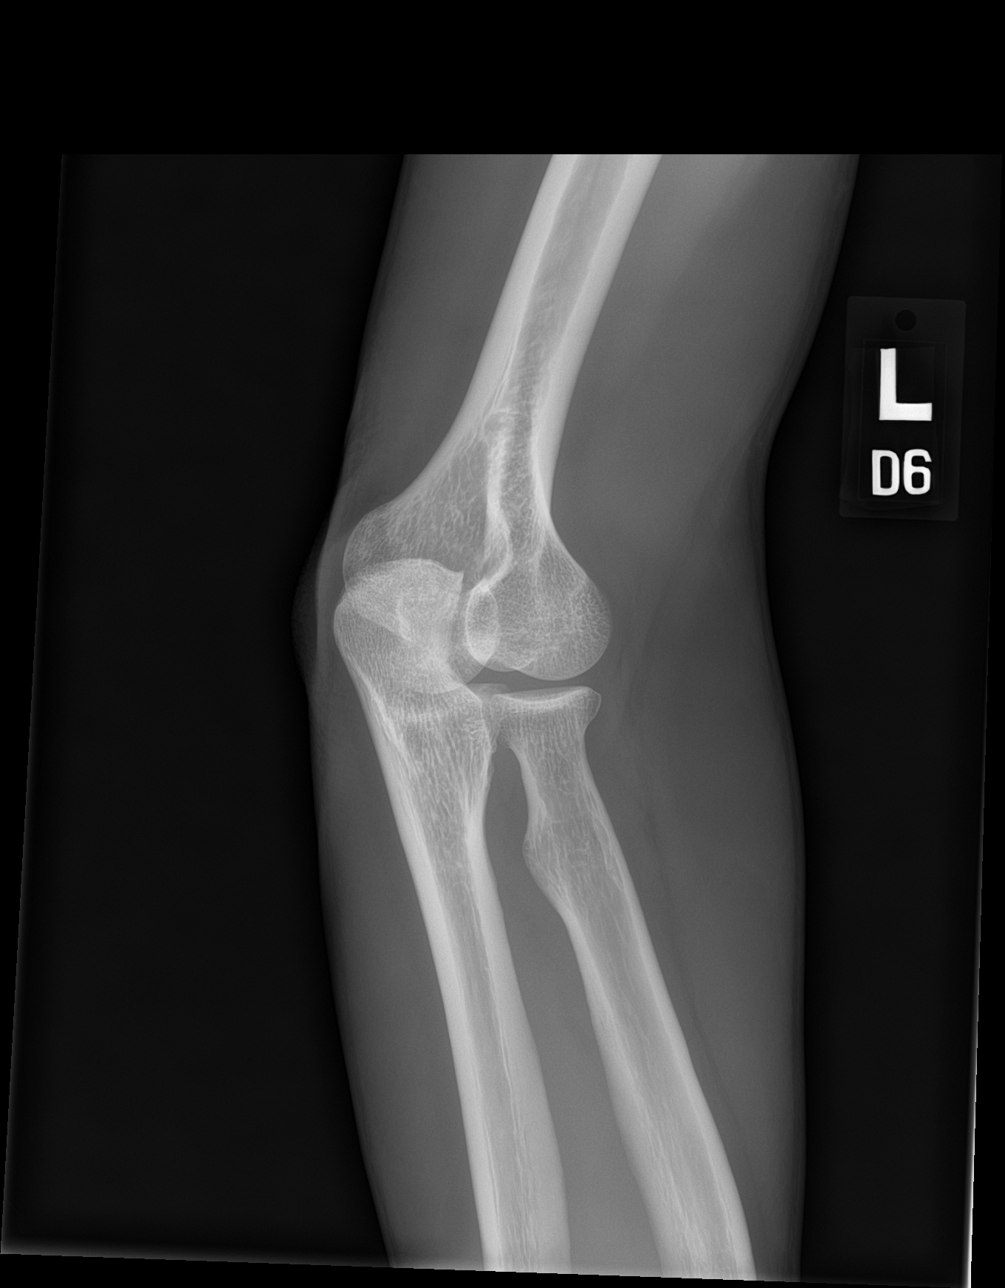

[4 of 4 positions shown; findings below may reference images not displayed]

FINDINGS: There is no evidence of fracture, dislocation, or joint effusion.
There is no evidence of arthropathy or other focal bone abnormality.
Soft tissues are unremarkable.
IMPRESSION: Negative.

## 2022-01-11 ENCOUNTER — Encounter (HOSPITAL_COMMUNITY): Payer: Self-pay | Admitting: Emergency Medicine

## 2022-01-11 ENCOUNTER — Emergency Department (HOSPITAL_COMMUNITY)
Admission: EM | Admit: 2022-01-11 | Discharge: 2022-01-11 | Disposition: A | Discharge status: Home / Self Care | Payer: Self-pay | Attending: Emergency Medicine | Admitting: Emergency Medicine

## 2022-01-11 ENCOUNTER — Other Ambulatory Visit: Payer: Self-pay

## 2022-01-11 DIAGNOSIS — R197 Diarrhea, unspecified: Secondary | ICD-10-CM | POA: Insufficient documentation

## 2022-01-11 DIAGNOSIS — R1084 Generalized abdominal pain: Secondary | ICD-10-CM | POA: Insufficient documentation

## 2022-01-11 DIAGNOSIS — R112 Nausea with vomiting, unspecified: Secondary | ICD-10-CM | POA: Insufficient documentation

## 2022-01-11 LAB — CBC
HCT: 44.5 % (ref 39.0–52.0)
Hemoglobin: 15.2 g/dL (ref 13.0–17.0)
MCH: 30.2 pg (ref 26.0–34.0)
MCHC: 34.2 g/dL (ref 30.0–36.0)
MCV: 88.3 fL (ref 80.0–100.0)
Platelets: 251 10*3/uL (ref 150–400)
RBC: 5.04 MIL/uL (ref 4.22–5.81)
RDW: 13 % (ref 11.5–15.5)
WBC: 12 10*3/uL — ABNORMAL HIGH (ref 4.0–10.5)
nRBC: 0 % (ref 0.0–0.2)

## 2022-01-11 LAB — COMPREHENSIVE METABOLIC PANEL
ALT: 15 U/L (ref 0–44)
AST: 17 U/L (ref 15–41)
Albumin: 5 g/dL (ref 3.5–5.0)
Alkaline Phosphatase: 50 U/L (ref 38–126)
Anion gap: 10 (ref 5–15)
BUN: 13 mg/dL (ref 6–20)
CO2: 25 mmol/L (ref 22–32)
Calcium: 9.5 mg/dL (ref 8.9–10.3)
Chloride: 102 mmol/L (ref 98–111)
Creatinine, Ser: 1.15 mg/dL (ref 0.61–1.24)
GFR, Estimated: >60 mL/min (ref >60)
Glucose, Bld: 109 mg/dL — ABNORMAL HIGH (ref 70–99)
Potassium: 3.9 mmol/L (ref 3.5–5.1)
Sodium: 137 mmol/L (ref 135–145)
Total Bilirubin: 1 mg/dL (ref 0.3–1.2)
Total Protein: 8 g/dL (ref 6.5–8.1)

## 2022-01-11 LAB — LIPASE, BLOOD: Lipase: 23 U/L (ref 11–51)

## 2022-01-11 MED ORDER — ONDANSETRON 4 MG PO TBDP: 4.0000 mg | ORAL_TABLET | Freq: Three times a day (TID) | ORAL | 0 refills | Status: DC | PRN

## 2022-01-11 MED ORDER — ONDANSETRON 4 MG PO TBDP
4.0000 mg | ORAL_TABLET | Freq: Once | ORAL | Status: AC
  Administered 2022-01-11: 4 mg via ORAL
  Filled 2022-01-11: qty 1

## 2022-01-11 MED ORDER — ONDANSETRON 4 MG PO TBDP: 4.0000 mg | ORAL_TABLET | Freq: Three times a day (TID) | ORAL | 0 refills | Status: AC | PRN

## 2022-01-11 NOTE — ED Triage Notes (Signed)
Pt states he thinks he has food poisoning. States after he ate a breakfast burrito, he became nauseous and has had vomiting and diarrhea "all day". Denies being around anyone else that has been sick.

## 2022-01-11 NOTE — ED Provider Notes (Signed)
[facility] EMERGENCY DEPARTMENT Provider Note   CSN: [id number] Arrival date & time: 01/11/22  2130     History  Chief Complaint  Patient presents with   Emesis and Diarrhea    Jason Morton is a 36 y.o. male.  36 year old male presents with complaint of nausea, vomiting, diarrhea onset this morning after eating a breakfast burrito.  No sick contacts, no recent travel, no recent antibiotics.  States that he is compliant with his Paxil and clonazepam which he takes for anxiety and depression.  Emesis and stools are nonbloody.  Reports diffuse abdominal cramping.  Patient is able to tolerate small sips of water.      Home Medications Prior to Admission medications   Medication Sig Start Date End Date Taking? Authorizing Provider  ondansetron (ZOFRAN-ODT) 4 MG disintegrating tablet Take 1 tablet (4 mg total) by mouth every 8 (eight) hours as needed for nausea or vomiting. 01/11/22  Yes Jeannie Fend, PA-C  clonazePAM (KLONOPIN) 0.5 MG tablet Take 0.5 mg by mouth daily.    [provider]  diphenhydrAMINE (BENADRYL ALLERGY) 25 mg capsule Take 1 capsule (25 mg total) by mouth every 4 (four) hours as needed for itching. May cause drowsiness 04/22/21   Triplett, Tammy, PA-C  doxycycline (VIBRAMYCIN) 100 MG capsule Take 1 capsule (100 mg total) by mouth 2 (two) times daily. 04/22/21   Triplett, Tammy, PA-C  PARoxetine (PAXIL) 10 MG tablet Take 10 mg by mouth daily.    [provider]  predniSONE (DELTASONE) 10 MG tablet Take 6 tablets day one, 5 tablets day two, 4 tablets day three, 3 tablets day four, 2 tablets day five, then 1 tablet day six 04/22/21   Triplett, Tammy, PA-C      Allergies    Patient has no known allergies.    Review of Systems   Review of Systems  Constitutional:  Negative for chills and fever.  Respiratory:  Negative for shortness of breath.   Cardiovascular:  Negative for chest pain.  Gastrointestinal:  Positive for abdominal pain, diarrhea, nausea  and vomiting. Negative for blood in stool.  Genitourinary:  Positive for decreased urine volume. Negative for difficulty urinating and dysuria.  Musculoskeletal:  Negative for arthralgias and myalgias.  Skin:  Negative for rash and wound.  Allergic/Immunologic: Negative for immunocompromised state.  Neurological:  Negative for weakness.  Psychiatric/Behavioral:  Negative for confusion.   All other systems reviewed and are negative.  Physical Exam Updated Vital Signs BP (!) 130/95 (BP Location: Right Arm)    Pulse 92    Temp 98.6 F (37 C) (Oral)    Resp 18    Ht 5\' 4"  (1.626 m)    Wt 52.2 kg    SpO2 95%    BMI 19.74 kg/m  Physical Exam Vitals and nursing note reviewed.  Constitutional:      General: He is not in acute distress.    Appearance: He is well-developed. He is not diaphoretic.  HENT:     Head: Normocephalic and atraumatic.  Eyes:     Conjunctiva/sclera: Conjunctivae normal.  Cardiovascular:     Rate and Rhythm: Normal rate and regular rhythm.     Heart sounds: Normal heart sounds.  Pulmonary:     Effort: Pulmonary effort is normal.     Breath sounds: Normal breath sounds.  Abdominal:     Palpations: Abdomen is soft.     Tenderness: There is abdominal tenderness.     Comments: Mild generalized tenderness  Musculoskeletal:  Right lower leg: No edema.     Left lower leg: No edema.  Skin:    General: Skin is warm and dry.  Neurological:     Mental Status: He is alert and oriented to person, place, and time.  Psychiatric:        Behavior: Behavior normal.    ED Results / Procedures / Treatments   Labs (all labs ordered are listed, but only abnormal results are displayed) Labs Reviewed  COMPREHENSIVE METABOLIC PANEL - Abnormal; Notable for the following components:      Result Value   Glucose, Bld 109 (*)    All other components within normal limits  CBC - Abnormal; Notable for the following components:   WBC 12.0 (*)    All other components within normal  limits  LIPASE, BLOOD  URINALYSIS, ROUTINE W REFLEX MICROSCOPIC    EKG None  Radiology No results found.  Procedures Procedures    Medications Ordered in ED Medications  ondansetron (ZOFRAN-ODT) disintegrating tablet 4 mg (4 mg Oral Given 01/11/22 1945)    ED Course/ Medical Decision Making/ A&P                           Medical Decision Making Amount and/or Complexity of Data Reviewed Labs: ordered.  Risk Prescription drug management.   36 year old male with complaint of nausea, vomiting, diarrhea with abdominal pain onset this morning as detailed above.  On exam, patient is well-appearing.  He has mild generalized abdominal tenderness with normal bowel sounds.  Patient was given Zofran, he is able to tolerate oral liquids, and is voiding.  Labs are reviewed, he has a mild leukocytosis with a white count of 12,000 which is nonspecific.  His CMP does not show any significant changes to his hepatic or renal function, no significant electrolyte disturbance.  Plan is to discharge with Zofran for symptom relief.  Recommend return for worsening or concerning symptoms.  Follow-up with PCP if symptoms or not improving.        Final Clinical Impression(s) / ED Diagnoses Final diagnoses:  Nausea vomiting and diarrhea    Rx / DC Orders ED Discharge Orders          Ordered    ondansetron (ZOFRAN-ODT) 4 MG disintegrating tablet  Every 8 hours PRN        01/11/22 2051              Jeannie Fend, PA-C 01/11/22 2053    Eber Hong, MD 01/11/22 2242

## 2022-01-11 NOTE — Discharge Instructions (Addendum)
Take Zofran as needed as prescribed for nausea and vomiting.  This medication can also help with diarrhea.  Recommend clear liquid diet, advance to bland foods slowly as tolerated.  Return to the emergency room for worsening or concerning symptoms otherwise recheck with your doctor if symptoms persist.

## 2022-02-12 ENCOUNTER — Emergency Department (HOSPITAL_COMMUNITY)
Admission: EM | Admit: 2022-02-12 | Discharge: 2022-02-12 | Disposition: A | Discharge status: Home / Self Care | Payer: Self-pay | Attending: Emergency Medicine | Admitting: Emergency Medicine

## 2022-02-12 ENCOUNTER — Encounter (HOSPITAL_COMMUNITY): Payer: Self-pay

## 2022-02-12 ENCOUNTER — Other Ambulatory Visit: Payer: Self-pay

## 2022-02-12 DIAGNOSIS — K029 Dental caries, unspecified: Secondary | ICD-10-CM | POA: Insufficient documentation

## 2022-02-12 MED ORDER — PENICILLIN V POTASSIUM 500 MG PO TABS: 500.0000 mg | ORAL_TABLET | Freq: Four times a day (QID) | ORAL | 0 refills | Status: AC

## 2022-02-12 NOTE — ED Provider Notes (Signed)
[facility] EMERGENCY DEPARTMENT Provider Note   CSN: [id number] Arrival date & time: 02/12/22  0847     History  Chief Complaint  Patient presents with   Jason Morton    EDY KRANER is a 36 y.o. male who presents to the ED complaining of right upper dental pain onset 4 days.  Patient does not have a dentist.  Has associated facial swelling.  Has tried Tylenol and a friend's amoxicillin prescription.  Denies gum swelling/bleeding, fever, chills, drainage, sore throat, nasal congestion, rhinorrhea.    The history is provided by the patient. No language interpreter was used.      Home Medications Prior to Admission medications   Medication Sig Start Date End Date Taking? Authorizing Provider  penicillin v potassium (VEETID) 500 MG tablet Take 1 tablet (500 mg total) by mouth 4 (four) times daily for 7 days. 02/12/22 02/19/22 Yes Akaila Rambo A, PA-C  clonazePAM (KLONOPIN) 0.5 MG tablet Take 0.5 mg by mouth daily.    [provider]  diphenhydrAMINE (BENADRYL ALLERGY) 25 mg capsule Take 1 capsule (25 mg total) by mouth every 4 (four) hours as needed for itching. May cause drowsiness 04/22/21   Triplett, Tammy, PA-C  doxycycline (VIBRAMYCIN) 100 MG capsule Take 1 capsule (100 mg total) by mouth 2 (two) times daily. 04/22/21   Triplett, Tammy, PA-C  ondansetron (ZOFRAN-ODT) 4 MG disintegrating tablet Take 1 tablet (4 mg total) by mouth every 8 (eight) hours as needed for nausea or vomiting. 01/11/22   Noemi Chapel, MD  PARoxetine (PAXIL) 10 MG tablet Take 10 mg by mouth daily.    [provider]  predniSONE (DELTASONE) 10 MG tablet Take 6 tablets day one, 5 tablets day two, 4 tablets day three, 3 tablets day four, 2 tablets day five, then 1 tablet day six 04/22/21   Triplett, Tammy, PA-C      Allergies    Patient has no known allergies.    Review of Systems   Review of Systems  Constitutional:  Negative for chills and fever.  HENT:  Positive for dental problem and facial  swelling. Negative for congestion, drooling, rhinorrhea, sore throat and trouble swallowing.   Skin:  Negative for rash.  All other systems reviewed and are negative.  Physical Exam Updated Vital Signs BP 118/79 (BP Location: Right Arm)    Pulse 70    Temp 97.7 F (36.5 C) (Oral)    Resp 20    Ht 5\' 5"  (1.651 m)    Wt 52.2 kg    SpO2 99%    BMI 19.14 kg/m  Physical Exam Vitals and nursing note reviewed.  Constitutional:      General: He is not in acute distress.    Appearance: He is not ill-appearing.  HENT:     Head: Normocephalic and atraumatic.     Right Ear: External ear normal.     Left Ear: External ear normal.     Nose: Nose normal.     Mouth/Throat:     Mouth: Mucous membranes are moist.     Dentition: Abnormal dentition. Dental tenderness and dental caries present.     Tongue: Tongue does not deviate from midline.     Pharynx: Oropharynx is clear. Uvula midline. No oropharyngeal exudate or posterior oropharyngeal erythema.     Tonsils: No tonsillar exudate or tonsillar abscesses.     Comments: Multiple dental caries noted throughout. Tenderness to palpation to right upper gum line. No fluctuance noted. No trismus. No retropharyngeal Jason Morton.  No peritonsillar Jason Morton noted. Uvula midline. Eyes:     Extraocular Movements: Extraocular movements intact.  Cardiovascular:     Rate and Rhythm: Normal rate and regular rhythm.     Pulses: Normal pulses.     Heart sounds: Normal heart sounds.  Pulmonary:     Effort: Pulmonary effort is normal. No respiratory distress.     Breath sounds: Normal breath sounds.  Abdominal:     Palpations: Abdomen is soft.  Musculoskeletal:        General: Normal range of motion.     Cervical back: Neck supple.  Lymphadenopathy:     Head:     Right side of head: No submental, submandibular, tonsillar, preauricular or posterior auricular adenopathy.     Left side of head: No submental, submandibular, tonsillar, preauricular or posterior auricular  adenopathy.     Cervical: No cervical adenopathy.  Skin:    General: Skin is warm and dry.     Findings: No rash.  Neurological:     Mental Status: He is alert.  Psychiatric:        Behavior: Behavior normal.    ED Results / Procedures / Treatments   Labs (all labs ordered are listed, but only abnormal results are displayed) Labs Reviewed - No data to display  EKG None  Radiology No results found.  Procedures Procedures    Medications Ordered in ED Medications - No data to display  ED Course/ Medical Decision Making/ A&P Clinical Course as of 02/12/22 0918  Thu Feb 12, 2022  0916 Discussed discharge treatment plan with patient at bedside.  Patient agreeable at this time.  Patient appears safe for discharge. [SB]    Clinical Course User Index [SB] Makhi Muzquiz A, PA-C                           Medical Decision Making Risk Prescription drug management.   Patient presents to the ED with right upper dental pain onset 4 days.  Patient does not have a dentist.  Has associated facial swelling.  Vital signs stable, patient afebrile, not tachycardic or hypoxic.  On exam patient with multiple dental caries noted throughout.  Tenderness to palpation noted to right upper gumline without fluctuance.  No retropharyngeal Jason Morton.  No peritonsillar Jason Morton noted.  Uvula midline.  No cardiovascular or respiratory exam findings.  Differential diagnosis includes pharyngeal Jason Morton, Ludwig's angina, dental Jason Morton.    Patient presentation suspicious for dentalgia. No Jason Morton requiring immediate incision and drainage.  Exam not concerning for Ludwig's angina or pharyngeal Jason Morton.  Will treat with penicillin prescription.  Provided resource guide for dentists in the area, pt instructed to follow-up with dentist.  Discussed return precautions. Pt safe for discharge.  Follow-up as indicated in discharge paperwork.    Final Clinical Impression(s) / ED Diagnoses Final diagnoses:  Pain due  to dental caries    Rx / DC Orders ED Discharge Orders          Ordered    penicillin v potassium (VEETID) 500 MG tablet  4 times daily        02/12/22 0916              Treavor Blomquist A, PA-C 02/12/22 0919    Milton Ferguson, MD 02/13/22 [phone]

## 2022-02-12 NOTE — Discharge Instructions (Signed)
It was a pleasure taking care of you today!   You will be prescribed Penicillin, take as directed and ensure to complete the entire course of the antibiotic. You may take over the counter 600 mg Ibuprofen every 6 hours or 500 mg Tylenol every 6 hours as needed for pain for no more than 7 days.  Attached you will find a resource guide for dentists in the area, call and set up a follow up appointment. Return to the Emergency Department if you are experiencing trouble swallowing, trouble breathing, fever, decreased fluid intake, or worsening symptoms.

## 2022-02-12 NOTE — ED Triage Notes (Signed)
Patient with complaints of dental pain and facial swelling on the right upper jaw that started Sunday.

## 2022-02-13 ENCOUNTER — Emergency Department (HOSPITAL_COMMUNITY)
Admission: EM | Admit: 2022-02-13 | Discharge: 2022-02-13 | Disposition: A | Discharge status: Home / Self Care | Payer: Self-pay | Attending: Emergency Medicine | Admitting: Emergency Medicine

## 2022-02-13 ENCOUNTER — Other Ambulatory Visit: Payer: Self-pay

## 2022-02-13 ENCOUNTER — Encounter (HOSPITAL_COMMUNITY): Payer: Self-pay

## 2022-02-13 DIAGNOSIS — F17290 Nicotine dependence, other tobacco product, uncomplicated: Secondary | ICD-10-CM | POA: Insufficient documentation

## 2022-02-13 DIAGNOSIS — K0889 Other specified disorders of teeth and supporting structures: Secondary | ICD-10-CM

## 2022-02-13 DIAGNOSIS — R220 Localized swelling, mass and lump, head: Secondary | ICD-10-CM

## 2022-02-13 HISTORY — DX: Dental caries, unspecified: K02.9

## 2022-02-13 MED ORDER — AMOXICILLIN-POT CLAVULANATE 875-125 MG PO TABS: 1.0000 | ORAL_TABLET | Freq: Two times a day (BID) | ORAL | 0 refills | Status: DC

## 2022-02-13 MED ORDER — AMOXICILLIN-POT CLAVULANATE 875-125 MG PO TABS: 1.0000 | ORAL_TABLET | Freq: Two times a day (BID) | ORAL | 0 refills | Status: AC

## 2022-02-13 NOTE — ED Triage Notes (Addendum)
Pt presents to ED with facial swelling and dental pain-pt dx with dental abscess - pt here for the same complaints of dental pain and facial swelling- pt has not taken anything for pain.

## 2022-02-13 NOTE — Discharge Instructions (Signed)
You were evaluated in the Emergency Department and after careful evaluation, we did not find any emergent condition requiring admission or further testing in the hospital.  Your exam/testing today was overall reassuring.  Symptoms seem to be due to a tooth infection.  Recommend stopping her penicillin and starting the Augmentin.  Use cold compresses today on the face to help with the swelling.  Recommend follow-up with dentist.  Please return to the Emergency Department if you experience any worsening of your condition.  Thank you for allowing [place] to be a part of your care.

## 2022-02-13 NOTE — ED Provider Notes (Signed)
[facility]-EMERGENCY DEPT [facility] Emergency Department Provider Note MRN:  [id number]  Arrival date & time: 02/13/22     Chief Complaint   Dental Pain (Seen yesterday-dx with dental abscess/)   History of Present Illness   Jason Morton is a 36 y.o. year-old male with no prior past medical history presenting to the ED with chief complaint of dental pain.  Pain to the right upper molars for the past few days.  Seen here in the emergency department yesterday and discharged on penicillin.  Has taken 3 penicillin tablets thus far.  Woke up this morning with worsening right-sided swelling to the face.  Pain is unchanged.  No fever.  Review of Systems  A thorough review of systems was obtained and all systems are negative except as noted in the HPI and PMH.   Patient's Health History    Past Medical History:  Diagnosis Date   ADHD    Anemia 2021   Anxiety    Dental caries    Depression     Past Surgical History:  Procedure Laterality Date   arm surgery Left     No family history on file.  Social History   Socioeconomic History   Marital status: Single    Spouse name: Not on file   Number of children: Not on file   Years of education: Not on file   Highest education level: Not on file  Occupational History   Not on file  Tobacco Use   Smoking status: Every Day    Types: E-cigarettes   Smokeless tobacco: Current    Types: Chew  Vaping Use   Vaping Use: Every day   Substances: Nicotine  Substance and Sexual Activity   Alcohol use: Yes   Drug use: No   Sexual activity: Not on file  Other Topics Concern   Not on file  Social History Narrative   Not on file   Social Determinants of Health   Financial Resource Strain: Not on file  Food Insecurity: Not on file  Transportation Needs: Not on file  Physical Activity: Not on file  Stress: Not on file  Social Connections: Not on file  Intimate Partner Violence: Not on file     Physical Exam   Vitals:    02/13/22 0608  BP: 125/84  Pulse: 71  Resp: 17  Temp: 98.1 F (36.7 C)  SpO2: 97%    CONSTITUTIONAL: Well-appearing, NAD NEURO/PSYCH:  Alert and oriented x 3, no focal deficits EYES:  eyes equal and reactive ENT/NECK:  no LAD, no JVD CARDIO: Regular rate, well-perfused, normal S1 and S2 PULM:  CTAB no wheezing or rhonchi GI/GU:  non-distended, non-tender MSK/SPINE:  No gross deformities, no edema SKIN: Mild swelling to the right buccal mucosa, periorbital region   *Additional and/or pertinent findings included in MDM below  Diagnostic and Interventional Summary    EKG Interpretation  Date/Time:    Ventricular Rate:    PR Interval:    QRS Duration:   QT Interval:    QTC Calculation:   R Axis:     Text Interpretation:         Labs Reviewed - No data to display  No orders to display    Medications - No data to display   Procedures  /  Critical Care Procedures  ED Course and Medical Decision Making  Initial Impression and Ddx Suspect tooth infection with some surrounding inflammation.  Suspect that laying flat has caused some more dependent edema to  be evident upon awakening this morning.  He has no fever, no signs of abscess or more significant contiguous infection inside the mouth.  There is a possibility this is a progression to an early facial cellulitis or preseptal cellulitis.  He has no pain with extraocular movements, no fluctuance, no indication for imaging.  Appropriate for discharge with different antibiotic.  Past medical/surgical history that increases complexity of ED encounter: None  Interpretation of Diagnostics Not applicable Patient Reassessment and Ultimate Disposition/Management Discharge home  Patient management required discussion with the following services or consulting groups:  None  Complexity of Problems Addressed Acute complicated illness or Injury  Additional Data Reviewed and Analyzed Further history obtained from: Prior ED  visit notes  Additional Factors Impacting ED Encounter Risk Prescriptions  [name], MD [facility] Emergency Medicine [facility] [email or web address]  Final Clinical Impressions(s) / ED Diagnoses     ICD-10-CM   1. Pain, dental  K08.89     2. Facial swelling  R22.0       ED Discharge Orders          Ordered    amoxicillin-clavulanate (AUGMENTIN) 875-125 MG tablet  Every 12 hours        02/13/22 0624             Discharge Instructions Discussed with and Provided to Patient:    Discharge Instructions      You were evaluated in the Emergency Department and after careful evaluation, we did not find any emergent condition requiring admission or further testing in the hospital.  Your exam/testing today was overall reassuring.  Symptoms seem to be due to a tooth infection.  Recommend stopping her penicillin and starting the Augmentin.  Use cold compresses today on the face to help with the swelling.  Recommend follow-up with dentist.  Please return to the Emergency Department if you experience any worsening of your condition.  Thank you for allowing [place] to be a part of your care.       Sabas Sous, MD 02/13/22 [phone]

## 2023-03-16 ENCOUNTER — Other Ambulatory Visit: Payer: Self-pay

## 2023-03-16 ENCOUNTER — Emergency Department (HOSPITAL_COMMUNITY)
Admission: EM | Admit: 2023-03-16 | Discharge: 2023-03-16 | Disposition: A | Discharge status: Home / Self Care | Payer: Self-pay | Attending: Emergency Medicine | Admitting: Emergency Medicine

## 2023-03-16 ENCOUNTER — Encounter (HOSPITAL_COMMUNITY): Payer: Self-pay

## 2023-03-16 DIAGNOSIS — L509 Urticaria, unspecified: Secondary | ICD-10-CM | POA: Insufficient documentation

## 2023-03-16 MED ORDER — FAMOTIDINE 20 MG PO TABS
20.0000 mg | ORAL_TABLET | Freq: Once | ORAL | Status: AC
  Administered 2023-03-16: 20 mg via ORAL
  Filled 2023-03-16: qty 1

## 2023-03-16 MED ORDER — DEXAMETHASONE 4 MG PO TABS
10.0000 mg | ORAL_TABLET | Freq: Once | ORAL | Status: AC
  Administered 2023-03-16: 10 mg via ORAL
  Filled 2023-03-16: qty 3

## 2023-03-16 MED ORDER — DIPHENHYDRAMINE HCL 25 MG PO CAPS
25.0000 mg | ORAL_CAPSULE | Freq: Once | ORAL | Status: AC
  Administered 2023-03-16: 25 mg via ORAL
  Filled 2023-03-16: qty 1

## 2023-03-16 NOTE — Discharge Instructions (Signed)
You received steroid, Pepcid, and Benadryl here in the emergency department.  Continue to take Benadryl as needed for itchiness.  Return to the emergency department for any new or worsening symptoms of concern.

## 2023-03-16 NOTE — ED Triage Notes (Signed)
Pt reports waking up this morning with red splotchy rash across body from head to toe. Pt denies pain besides face feeling like a sunburn.
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
## 2023-12-21 ENCOUNTER — Other Ambulatory Visit: Payer: Self-pay

## 2023-12-21 ENCOUNTER — Emergency Department (HOSPITAL_COMMUNITY)
Admission: EM | Admit: 2023-12-21 | Discharge: 2023-12-21 | Disposition: A | Discharge status: Home / Self Care | Payer: PRIVATE HEALTH INSURANCE

## 2023-12-21 ENCOUNTER — Encounter (HOSPITAL_COMMUNITY): Payer: Self-pay | Admitting: *Deleted

## 2023-12-21 DIAGNOSIS — Z23 Encounter for immunization: Secondary | ICD-10-CM | POA: Diagnosis not present

## 2023-12-21 DIAGNOSIS — Z79899 Other long term (current) drug therapy: Secondary | ICD-10-CM | POA: Diagnosis not present

## 2023-12-21 DIAGNOSIS — T300 Burn of unspecified body region, unspecified degree: Secondary | ICD-10-CM

## 2023-12-21 DIAGNOSIS — S20324A Blister (nonthermal) of middle front wall of thorax, initial encounter: Secondary | ICD-10-CM | POA: Diagnosis present

## 2023-12-21 DIAGNOSIS — X58XXXA Exposure to other specified factors, initial encounter: Secondary | ICD-10-CM | POA: Diagnosis not present

## 2023-12-21 MED ORDER — OXYCODONE-ACETAMINOPHEN 5-325 MG PO TABS
1.0000 | ORAL_TABLET | Freq: Once | ORAL | Status: AC
  Administered 2023-12-21: 1 via ORAL
  Filled 2023-12-21: qty 1

## 2023-12-21 MED ORDER — TETANUS-DIPHTH-ACELL PERTUSSIS 5-2.5-18.5 LF-MCG/0.5 IM SUSY
0.5000 mL | PREFILLED_SYRINGE | Freq: Once | INTRAMUSCULAR | Status: AC
  Administered 2023-12-21: 0.5 mL via INTRAMUSCULAR
  Filled 2023-12-21: qty 0.5

## 2023-12-21 MED ORDER — OXYCODONE-ACETAMINOPHEN 5-325 MG PO TABS: 1.0000 | ORAL_TABLET | Freq: Four times a day (QID) | ORAL | 0 refills | Status: AC | PRN
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
## 2024-07-05 ENCOUNTER — Other Ambulatory Visit: Payer: Self-pay

## 2024-07-05 ENCOUNTER — Emergency Department (HOSPITAL_COMMUNITY)
Admission: EM | Admit: 2024-07-05 | Discharge: 2024-07-05 | Disposition: A | Discharge status: Home / Self Care | Payer: Self-pay | Attending: Student | Admitting: Student

## 2024-07-05 ENCOUNTER — Encounter (HOSPITAL_COMMUNITY): Payer: Self-pay

## 2024-07-05 DIAGNOSIS — J039 Acute tonsillitis, unspecified: Secondary | ICD-10-CM | POA: Insufficient documentation

## 2024-07-05 DIAGNOSIS — R509 Fever, unspecified: Secondary | ICD-10-CM | POA: Insufficient documentation

## 2024-07-05 DIAGNOSIS — M791 Myalgia, unspecified site: Secondary | ICD-10-CM | POA: Insufficient documentation

## 2024-07-05 LAB — RESP PANEL BY RT-PCR (RSV, FLU A&B, COVID)  RVPGX2
Influenza A by PCR: NEGATIVE
Influenza B by PCR: NEGATIVE
Resp Syncytial Virus by PCR: NEGATIVE
SARS Coronavirus 2 by RT PCR: NEGATIVE

## 2024-07-05 LAB — GROUP A STREP BY PCR: Group A Strep by PCR: NOT DETECTED

## 2024-07-05 LAB — MONONUCLEOSIS SCREEN: Mono Screen: NEGATIVE

## 2024-07-05 MED ORDER — PREDNISONE 10 MG PO TABS
40.0000 mg | ORAL_TABLET | Freq: Every day | ORAL | 0 refills | Status: AC
Start: 2024-07-05 — End: 2024-07-10

## 2024-07-05 MED ORDER — AMOXICILLIN 875 MG PO TABS: 875.0000 mg | ORAL_TABLET | Freq: Two times a day (BID) | ORAL | 0 refills | Status: DC

## 2024-07-05 NOTE — ED Triage Notes (Signed)
 Pt arrived via POV c/o fever, sore throat and body aches since yesterday. Pt reports taking multiple OTC medications. Pt reports last taking Tylenol  this morning. Highest temp at home is 101.13F.

## 2024-07-05 NOTE — ED Provider Notes (Signed)
 [place] EMERGENCY DEPARTMENT AT [facility] Provider Note   CSN: [id number] Arrival date & time: 07/05/24  9095     Patient presents with: Fever   Jason Morton is a 38 y.o. male.   Patient is a 38 year old male who presents to the emergency department with a chief complaint of bodyaches, sore throat, fever, chills which have been ongoing since yesterday.  Patient denies any known sick contacts.  He does note to a mild cough.  He has had no abdominal pain, nausea, vomiting, diarrhea.  He notes he feels as though his lymph nodes are swollen as well.   Fever Associated symptoms: sore throat        Prior to Admission medications   Medication Sig Start Date End Date Taking? Authorizing Provider  clonazePAM (KLONOPIN) 0.5 MG tablet Take 0.5 mg by mouth daily.    [provider]  diphenhydrAMINE  (BENADRYL  ALLERGY) 25 mg capsule Take 1 capsule (25 mg total) by mouth every 4 (four) hours as needed for itching. May cause drowsiness Patient not taking: Reported on 03/16/2023 04/22/21   Triplett, Madelin, PA-C  doxycycline  (VIBRAMYCIN ) 100 MG capsule Take 1 capsule (100 mg total) by mouth 2 (two) times daily. Patient not taking: Reported on 03/16/2023 04/22/21   Triplett, Madelin, PA-C  ondansetron  (ZOFRAN -ODT) 4 MG disintegrating tablet Take 1 tablet (4 mg total) by mouth every 8 (eight) hours as needed for nausea or vomiting. Patient not taking: Reported on 03/16/2023 01/11/22   Cleotilde Rogue, MD  PARoxetine (PAXIL) 10 MG tablet Take 10 mg by mouth daily.    [provider]  predniSONE  (DELTASONE ) 10 MG tablet Take 6 tablets day one, 5 tablets day two, 4 tablets day three, 3 tablets day four, 2 tablets day five, then 1 tablet day six Patient not taking: Reported on 03/16/2023 04/22/21   Herlinda Madelin, PA-C    Allergies: Doxycycline     Review of Systems  Constitutional:  Positive for fever.  HENT:  Positive for sore throat.   All other systems reviewed and are  negative.   Updated Vital Signs BP 130/79 (BP Location: Right Arm)   Pulse 96   Temp 99.8 F (37.7 C) (Oral)   Resp 18   Ht 5' 5 (1.651 m)   Wt 60 kg   SpO2 95%   BMI 22.01 kg/m   Physical Exam Vitals and nursing note reviewed.  Constitutional:      Appearance: Normal appearance.  HENT:     Head: Normocephalic and atraumatic.     Nose: Nose normal. No congestion or rhinorrhea.     Mouth/Throat:     Mouth: Mucous membranes are moist.     Comments: Mild bilateral tonsillar swelling with exudate, pharyngeal erythema noted, no peritonsillar swelling, floor mouth is soft, tolerate secretions without difficulty Eyes:     Extraocular Movements: Extraocular movements intact.     Conjunctiva/sclera: Conjunctivae normal.     Pupils: Pupils are equal, round, and reactive to light.  Neck:     Comments: Anterior cervical lymphadenopathy noted Cardiovascular:     Rate and Rhythm: Normal rate and regular rhythm.     Pulses: Normal pulses.     Heart sounds: Normal heart sounds. No murmur heard.    No gallop.  Pulmonary:     Effort: Pulmonary effort is normal. No respiratory distress.     Breath sounds: Normal breath sounds. No stridor. No wheezing, rhonchi or rales.  Abdominal:     General: Abdomen is flat.  Bowel sounds are normal. There is no distension.     Palpations: Abdomen is soft.     Tenderness: There is no abdominal tenderness. There is no guarding.  Musculoskeletal:        General: Normal range of motion.     Cervical back: Normal range of motion and neck supple. No rigidity or tenderness.  Skin:    General: Skin is warm and dry.     Findings: No bruising or rash.  Neurological:     General: No focal deficit present.     Mental Status: He is alert and oriented to person, place, and time. Mental status is at baseline.  Psychiatric:        Mood and Affect: Mood normal.        Behavior: Behavior normal.        Thought Content: Thought content normal.        Judgment:  Judgment normal.     (all labs ordered are listed, but only abnormal results are displayed) Labs Reviewed  GROUP A STREP BY PCR  RESP PANEL BY RT-PCR (RSV, FLU A&B, COVID)  [id number]    EKG: None  Radiology: No results found.   Procedures   Medications Ordered in the ED - No data to display                                  Medical Decision Making Patient is doing well at this time and is stable for discharge home.  Discussed with patient that swabs and monotest have been negative in the emergency department.  Will still cover for strep pharyngitis as patient has positive via his [facility] criteria.  Patient has no indication for acute peritonsillar abscess, Ludwig angina, retropharyngeal abscess, epiglottitis.  He has no signs of acute airway compromise.  Vital signs are stable with no indication for sepsis.  Do not SPECT any further workup or admission is warranted at this time.  Close follow-up with PCP was discussed as well as strict turn precautions for any new or worsening symptoms.  Patient voiced understanding and had no additional questions.  Amount and/or Complexity of Data Reviewed Labs: ordered.  Risk Prescription drug management.        Final diagnoses:  None    ED Discharge Orders     None          Daralene Lonni JONETTA DEVONNA 07/05/24 1045    Kommor, Ethridge, MD 07/06/24 [phone]

## 2024-07-05 NOTE — Discharge Instructions (Signed)
 Please follow-up closely with your primary care doctor on an outpatient basis.  Take all antibiotics as directed.  Return to emergency department immediately for any new or worsening symptoms.

## 2024-07-09 ENCOUNTER — Encounter (HOSPITAL_COMMUNITY): Payer: Self-pay | Admitting: *Deleted

## 2024-07-09 ENCOUNTER — Other Ambulatory Visit: Payer: Self-pay

## 2024-07-09 ENCOUNTER — Emergency Department (HOSPITAL_COMMUNITY)
Admission: EM | Admit: 2024-07-09 | Discharge: 2024-07-09 | Disposition: A | Discharge status: Home / Self Care | Payer: Self-pay | Attending: Emergency Medicine | Admitting: Emergency Medicine

## 2024-07-09 DIAGNOSIS — J029 Acute pharyngitis, unspecified: Secondary | ICD-10-CM | POA: Insufficient documentation

## 2024-07-09 LAB — CBC WITH DIFFERENTIAL/PLATELET
Abs Immature Granulocytes: 0.03 K/uL (ref 0.00–0.07)
Basophils Absolute: 0 K/uL (ref 0.0–0.1)
Basophils Relative: 1 %
Eosinophils Absolute: 0 K/uL (ref 0.0–0.5)
Eosinophils Relative: 0 %
HCT: 49.7 % (ref 39.0–52.0)
Hemoglobin: 16.5 g/dL (ref 13.0–17.0)
Immature Granulocytes: 1 %
Lymphocytes Relative: 8 %
Lymphs Abs: 0.6 K/uL — ABNORMAL LOW (ref 0.7–4.0)
MCH: 29.8 pg (ref 26.0–34.0)
MCHC: 33.2 g/dL (ref 30.0–36.0)
MCV: 89.7 fL (ref 80.0–100.0)
Monocytes Absolute: 0.4 K/uL (ref 0.1–1.0)
Monocytes Relative: 6 %
Neutro Abs: 5.6 K/uL (ref 1.7–7.7)
Neutrophils Relative %: 84 %
Platelets: 224 K/uL (ref 150–400)
RBC: 5.54 MIL/uL (ref 4.22–5.81)
RDW: 12.4 % (ref 11.5–15.5)
WBC: 6.8 K/uL (ref 4.0–10.5)
nRBC: 0 % (ref 0.0–0.2)

## 2024-07-09 LAB — BASIC METABOLIC PANEL WITH GFR
Anion gap: 12 (ref 5–15)
BUN: 18 mg/dL (ref 6–20)
CO2: 28 mmol/L (ref 22–32)
Calcium: 9.6 mg/dL (ref 8.9–10.3)
Chloride: 95 mmol/L — ABNORMAL LOW (ref 98–111)
Creatinine, Ser: 1.22 mg/dL (ref 0.61–1.24)
GFR, Estimated: >60 mL/min (ref >60)
Glucose, Bld: 138 mg/dL — ABNORMAL HIGH (ref 70–99)
Potassium: 4.3 mmol/L (ref 3.5–5.1)
Sodium: 135 mmol/L (ref 135–145)

## 2024-07-09 MED ORDER — CEPHALEXIN 250 MG/5ML PO SUSR
500.0000 mg | Freq: Four times a day (QID) | ORAL | 0 refills | Status: AC
Start: 2024-07-09 — End: 2024-07-16

## 2024-07-09 MED ORDER — SODIUM CHLORIDE 0.9 % IV SOLN
2.0000 g | Freq: Once | INTRAVENOUS | Status: AC
  Administered 2024-07-09: 2 g via INTRAVENOUS
  Filled 2024-07-09: qty 20

## 2024-07-09 MED ORDER — SODIUM CHLORIDE 0.9 % IV BOLUS
1000.0000 mL | Freq: Once | INTRAVENOUS | Status: AC
  Administered 2024-07-09: 1000 mL via INTRAVENOUS

## 2024-07-09 MED ORDER — HYDROCODONE-ACETAMINOPHEN 7.5-325 MG/15ML PO SOLN: 10.0000 mL | Freq: Four times a day (QID) | ORAL | 0 refills | Status: AC | PRN

## 2024-07-09 NOTE — Discharge Instructions (Addendum)
 Follow up with Dr. Soldatova or one of her associates in one week.   Stop taking the amoxicillin 

## 2024-07-09 NOTE — ED Triage Notes (Signed)
 Pt here for same for sore throat.  Denies any fever.  States pain has not gotten any better.  Pt states he has been taking the prescribed antibiotic.

## 2024-07-11 NOTE — ED Provider Notes (Signed)
 [place] EMERGENCY DEPARTMENT AT [facility] Provider Note   CSN: [id number] Arrival date & time: 07/09/24  1336     Patient presents with: Sore Throat   Jason Morton is a 38 y.o. male.   Patient complains of a sore throat.  He has been on amoxicillin  without help.  He has had a strep test and a mono and COVID and flu which were negative  The history is provided by the patient and medical records. No language interpreter was used.  Sore Throat The current episode started more than 2 days ago. The problem occurs constantly. The problem has not changed since onset.Pertinent negatives include no chest pain, no abdominal pain and no headaches. Nothing aggravates the symptoms. Nothing relieves the symptoms. He has tried nothing for the symptoms.       Prior to Admission medications   Medication Sig Start Date End Date Taking? Authorizing Provider  cephALEXin  (KEFLEX ) 250 MG/5ML suspension Take 10 mLs (500 mg total) by mouth 4 (four) times daily for 7 days. 07/09/24 07/16/24 Yes Kahleah Crass, MD  HYDROcodone -acetaminophen  (HYCET) 7.5-325 mg/15 ml solution Take 10 mLs by mouth every 6 (six) hours as needed for moderate pain (pain score 4-6) or severe pain (pain score 7-10). 07/09/24 07/09/25 Yes Cereniti Curb, MD  clonazePAM (KLONOPIN) 0.5 MG tablet Take 0.5 mg by mouth daily.    [provider]  diphenhydrAMINE  (BENADRYL  ALLERGY) 25 mg capsule Take 1 capsule (25 mg total) by mouth every 4 (four) hours as needed for itching. May cause drowsiness Patient not taking: Reported on 03/16/2023 04/22/21   Triplett, Tammy, PA-C  doxycycline  (VIBRAMYCIN ) 100 MG capsule Take 1 capsule (100 mg total) by mouth 2 (two) times daily. Patient not taking: Reported on 03/16/2023 04/22/21   Triplett, Tammy, PA-C  ondansetron  (ZOFRAN -ODT) 4 MG disintegrating tablet Take 1 tablet (4 mg total) by mouth every 8 (eight) hours as needed for nausea or vomiting. Patient not taking: Reported on  03/16/2023 01/11/22   Cleotilde Rogue, MD  PARoxetine (PAXIL) 10 MG tablet Take 10 mg by mouth daily.    [provider]    Allergies: Doxycycline     Review of Systems  Constitutional:  Negative for appetite change and fatigue.  HENT:  Negative for congestion, ear discharge and sinus pressure.        Pharynx inflamed with exudate  Eyes:  Negative for discharge.  Respiratory:  Negative for cough.   Cardiovascular:  Negative for chest pain.  Gastrointestinal:  Negative for abdominal pain and diarrhea.  Genitourinary:  Negative for frequency and hematuria.  Musculoskeletal:  Negative for back pain.  Skin:  Negative for rash.  Neurological:  Negative for seizures and headaches.  Psychiatric/Behavioral:  Negative for hallucinations.     Updated Vital Signs BP (!) 140/80 (BP Location: Right Arm)   Pulse 88   Temp 98 F (36.7 C) (Oral)   Resp 16   Ht 5' 5 (1.651 m)   Wt 56.7 kg   SpO2 98%   BMI 20.80 kg/m   Physical Exam  (all labs ordered are listed, but only abnormal results are displayed) Labs Reviewed  CBC WITH DIFFERENTIAL/PLATELET - Abnormal; Notable for the following components:      Result Value   Lymphs Abs 0.6 (*)    All other components within normal limits  BASIC METABOLIC PANEL WITH GFR - Abnormal; Notable for the following components:   Chloride 95 (*)    Glucose, Bld 138 (*)  All other components within normal limits    EKG: None  Radiology: No results found.   Procedures   Medications Ordered in the ED  sodium chloride  0.9 % bolus 1,000 mL (0 mLs Intravenous Stopped 07/09/24 1703)  cefTRIAXone  (ROCEPHIN ) 2 g in sodium chloride  0.9 % 100 mL IVPB (0 g Intravenous Stopped 07/09/24 1626)                                    Medical Decision Making Amount and/or Complexity of Data Reviewed Labs: ordered.  Risk Prescription drug management.  Patient with most likely viral pharyngitis that does not improving.  Will stop the amoxicillin  and  start him on some Keflex  and follow-up with ENT     Final diagnoses:  Pharyngitis, unspecified etiology    ED Discharge Orders          Ordered    cephALEXin  (KEFLEX ) 250 MG/5ML suspension  4 times daily        07/09/24 1654    HYDROcodone -acetaminophen  (HYCET) 7.5-325 mg/15 ml solution  Every 6 hours PRN        07/09/24 1654               Suzette Pac, MD 07/11/24 1309

## 2024-07-14 ENCOUNTER — Encounter (INDEPENDENT_AMBULATORY_CARE_PROVIDER_SITE_OTHER): Payer: Self-pay

## 2024-07-18 ENCOUNTER — Ambulatory Visit (INDEPENDENT_AMBULATORY_CARE_PROVIDER_SITE_OTHER): Payer: PRIVATE HEALTH INSURANCE | Admitting: Otolaryngology

## 2024-07-18 ENCOUNTER — Encounter (INDEPENDENT_AMBULATORY_CARE_PROVIDER_SITE_OTHER): Payer: Self-pay | Admitting: Otolaryngology

## 2024-07-18 VITALS — BP 129/83 | HR 114

## 2024-07-18 DIAGNOSIS — K219 Gastro-esophageal reflux disease without esophagitis: Secondary | ICD-10-CM

## 2024-07-18 DIAGNOSIS — F17220 Nicotine dependence, chewing tobacco, uncomplicated: Secondary | ICD-10-CM | POA: Diagnosis not present

## 2024-07-18 DIAGNOSIS — J312 Chronic pharyngitis: Secondary | ICD-10-CM

## 2024-07-18 DIAGNOSIS — R0981 Nasal congestion: Secondary | ICD-10-CM

## 2024-07-18 DIAGNOSIS — R0982 Postnasal drip: Secondary | ICD-10-CM

## 2024-07-18 DIAGNOSIS — J3089 Other allergic rhinitis: Secondary | ICD-10-CM

## 2024-07-18 NOTE — Progress Notes (Signed)
 ENT CONSULT:  Reason for Consult: chronic sore throat and reflux    HPI: Discussed the use of AI scribe software for clinical note transcription with the patient, who gave verbal consent to proceed.  History of Present Illness Jason Morton is a 38 year old male who presents with persistent sore throat and reflux symptoms.  He has been experiencing a sore throat for which he visited the ER, his tonsils were swollen and covered in exudate. He was treated with Keflex  (cefalexin). He completed the course of antibiotics yesterday and notes some improvement, but continues to experience pain when swallowing. He is able to consume soft foods such as macaroni and cheese and mashed potatoes, but even drinking water causes discomfort.  He describes symptoms of acid reflux and heartburn, noting that the discomfort extends from his throat to his chest. He has been managing these symptoms with over-the-counter Pepcid , which he feels is helping. No postnasal drainage. He reports ear pain associated with the sore throat.  In the past year, he has required antibiotics for a sore throat only once, aside from the current episode. He has a follow-up appointment with his primary care doctor in a few weeks.  He reports persistent sore throat pain, difficulty swallowing, and ear pain associated with the sore throat.   Records Reviewed:  ED visit 07/09/24 [name] is a 38 y.o. male.     Patient complains of a sore throat.  He has been on amoxicillin  without help.  He has had a strep test and a mono and COVID and flu which were negative  The history is provided by the patient and medical records. No language interpreter was used.  Sore Throat The current episode started more than 2 days ago. The problem occurs constantly. The problem has not changed since onset.Pertinent negatives include no chest pain, no abdominal pain and no headaches. Nothing aggravates the symptoms. Nothing relieves the symptoms. He has  tried nothing for the symptoms.   Sent home on Keflex     Past Medical History:  Diagnosis Date   ADHD    Anemia 2021   Anxiety    Dental caries    Depression     Past Surgical History:  Procedure Laterality Date   arm surgery Left     History reviewed. No pertinent family history.  Social History:  reports that he has been smoking e-cigarettes. His smokeless tobacco use includes chew. He reports current alcohol use. He reports that he does not use drugs.  Allergies:  Allergies  Allergen Reactions   Doxycycline  Rash    Medications: I have reviewed the patient's current medications.  The PMH, PSH, Medications, Allergies, and SH were reviewed and updated.  ROS: Constitutional: Negative for fever, weight loss and weight gain. Cardiovascular: Negative for chest pain and dyspnea on exertion. Respiratory: Is not experiencing shortness of breath at rest. Gastrointestinal: Negative for nausea and vomiting. Neurological: Negative for headaches. Psychiatric: The patient is not nervous/anxious  Blood pressure 129/83, pulse (!) 114, SpO2 96%. There is no height or weight on file to calculate BMI.  PHYSICAL EXAM:  Exam: General: Well-developed, well-nourished Communication and Voice: Clear pitch and clarity Respiratory Respiratory effort: Equal inspiration and expiration without stridor Cardiovascular Peripheral Vascular: Warm extremities with equal color/perfusion Eyes: No nystagmus with equal extraocular motion bilaterally Neuro/Psych/Balance: Patient oriented to person, place, and time; Appropriate mood and affect; Gait is intact with no imbalance; Cranial nerves I-XII are intact Head and Face Inspection: Normocephalic and atraumatic without  mass or lesion Palpation: Facial skeleton intact without bony stepoffs Salivary Glands: No mass or tenderness Facial Strength: Facial motility symmetric and full bilaterally ENT Pinna: External ear intact and fully  developed External canal: Canal is patent with intact skin Tympanic Membrane: Clear and mobile External Nose: No scar or anatomic deformity Internal Nose: Septum is relatively straight. No polyp, or purulence. Mucosal edema and erythema present.  Bilateral inferior turbinate hypertrophy.  Lips, Teeth, and gums: Mucosa and teeth intact and viable TMJ: No pain to palpation with full mobility Oral cavity/oropharynx: No erythema or exudate, no lesions present 2 + tonsils Nasopharynx: No mass or lesion with intact mucosa Hypopharynx: Intact mucosa without pooling of secretions Larynx Glottic: Full true vocal cord mobility without lesion or mass Supraglottic: Normal appearing epiglottis and AE folds Interarytenoid Space: Moderate pachydermia&edema Subglottic Space: Patent without lesion or edema Neck Neck and Trachea: Midline trachea without mass or lesion Thyroid: No mass or nodularity Lymphatics: No lymphadenopathy  Procedure: Preoperative diagnosis: chronic sore throat recurrent pharyngitis   Postoperative diagnosis:   Same  Procedure: Flexible fiberoptic laryngoscopy  Surgeon: Elena Larry, MD  Anesthesia: Topical lidocaine  and Afrin Complications: None Condition is stable throughout exam  Indications and consent:  The patient presents to the clinic with above symptoms. Indirect laryngoscopy view was incomplete. Thus it was recommended that they undergo a flexible fiberoptic laryngoscopy. All of the risks, benefits, and potential complications were reviewed with the patient preoperatively and verbal informed consent was obtained.  Procedure: The patient was seated upright in the clinic. Topical lidocaine  and Afrin were applied to the nasal cavity. After adequate anesthesia had occurred, I then proceeded to pass the flexible telescope into the nasal cavity. The nasal cavity was patent without rhinorrhea or polyp. The nasopharynx was also patent without mass or lesion. The base of  tongue was visualized and was normal. There were no signs of pooling of secretions in the piriform sinuses. The true vocal folds were mobile bilaterally. There were no signs of glottic or supraglottic mucosal lesion or mass. There was moderate interarytenoid pachydermia and post cricoid edema. The telescope was then slowly withdrawn and the patient tolerated the procedure throughout.    Studies Reviewed: CT head 05/19/2019 FINDINGS: CT HEAD FINDINGS   Brain: 2 small foci pneumocephalus are seen along the left frontal convexities. There is an associated small amount of subdural hemorrhage measuring 2-3 mm in thickness. No subarachnoid hemorrhage is identified. No midline shift, mass, hydrocephalus or infarct.   Vascular: No hyperdense vessel or unexpected calcification.   Skull: The patient has a mildly depressed fracture of the left parietal bone.   Sinuses/Orbits: Negative.   Other: None.   CT CERVICAL SPINE FINDINGS   Alignment: Maintained.   Skull base and vertebrae: No acute fracture. No primary bone lesion or focal pathologic process.   Soft tissues and spinal canal: No prevertebral fluid or swelling. No visible canal hematoma.   Disc levels:  Normal.   Upper chest: Lung apices clear.   Other: None.   IMPRESSION: Mildly depressed left parietal bone fracture with a small amount underlying subdural hemorrhage measuring 2-3 mm in thickness and pneumocephalus.   Negative cervical spine CT scan.     Assessment/Plan: Encounter Diagnoses  Name Primary?   Chronic GERD    Environmental and seasonal allergies    Chronic nasal congestion    Post-nasal drip    Chronic sore throat [J31.2] Yes   Assessment & Plan  chronic sore throat  Persistent sore throat  with ear pain likely due to referred pain. Tonsils are 2+ w/o exudate today. Possible exacerbation by GERD. He had evidence of GERD LPR on scope exam, otherwise scope exam was unremarkable.  - Gargle with salt  water. - Manage reflux symptoms.  Gastroesophageal reflux disease (GERD) LPR Symptoms of GERD may contribute to sore throat. Risk of Barrett's esophagitis if severe reflux persists. - Pepcid  20 mg BID  -  Reflux Gourmet after meals - diet and lifestyle changes to minimize GERD - Refer to [employer] blog for dietary and lifestyle modifications/reflux cook book - discuss GI referral with PCP if sx will not improve   Thank you for allowing me to participate in the care of this patient. Please do not hesitate to contact me with any questions or concerns.   Elena Larry, MD Otolaryngology [facility] Phone: [phone] Fax: [phone]    07/18/2024, 1:26 PM

## 2024-07-18 NOTE — Patient Instructions (Signed)
